# Patient Record
Sex: Female | Born: 1940 | Race: White | Hispanic: No | Marital: Married | Smoking: Never smoker
Health system: Southern US, Community
[De-identification: ages and names within clinical notes are randomized; demographics above are authoritative.]

## PROBLEM LIST (undated history)

## (undated) DIAGNOSIS — R2231 Localized swelling, mass and lump, right upper limb: Secondary | ICD-10-CM

## (undated) DIAGNOSIS — R2230 Localized swelling, mass and lump, unspecified upper limb: Secondary | ICD-10-CM

## (undated) DIAGNOSIS — H269 Unspecified cataract: Secondary | ICD-10-CM

## (undated) DIAGNOSIS — Z853 Personal history of malignant neoplasm of breast: Secondary | ICD-10-CM

## (undated) DIAGNOSIS — M1990 Unspecified osteoarthritis, unspecified site: Secondary | ICD-10-CM

## (undated) DIAGNOSIS — C801 Malignant (primary) neoplasm, unspecified: Secondary | ICD-10-CM

## (undated) DIAGNOSIS — Z974 Presence of external hearing-aid: Secondary | ICD-10-CM

## (undated) DIAGNOSIS — I10 Essential (primary) hypertension: Secondary | ICD-10-CM

## (undated) DIAGNOSIS — Z972 Presence of dental prosthetic device (complete) (partial): Secondary | ICD-10-CM

## (undated) HISTORY — PX: WISDOM TOOTH EXTRACTION: SHX21

## (undated) HISTORY — PX: BREAST LUMPECTOMY: SHX2

## (undated) HISTORY — DX: Unspecified cataract: H26.9

## (undated) HISTORY — DX: Malignant (primary) neoplasm, unspecified: C80.1

## (undated) HISTORY — PX: REMOVAL OF URINARY SLING: SHX6218

## (undated) HISTORY — PX: COLONOSCOPY: SHX174

## (undated) HISTORY — PX: CATARACT EXTRACTION W/ INTRAOCULAR LENS  IMPLANT, BILATERAL: SHX1307

## (undated) HISTORY — PX: CATARACT EXTRACTION: SUR2

## (undated) HISTORY — PX: LUMBAR FUSION: SHX111

## (undated) HISTORY — PX: ABDOMINAL HYSTERECTOMY: SHX81

---

## 1993-05-20 HISTORY — PX: ABDOMINAL HYSTERECTOMY: SHX81

## 1997-08-18 ENCOUNTER — Encounter: Admission: RE | Admit: 1997-08-18 | Discharge: 1997-11-16 | Payer: Self-pay | Admitting: Radiation Oncology

## 1998-08-21 ENCOUNTER — Other Ambulatory Visit: Admission: RE | Admit: 1998-08-21 | Discharge: 1998-08-21 | Payer: Self-pay | Admitting: General Surgery

## 1998-09-06 ENCOUNTER — Ambulatory Visit (HOSPITAL_BASED_OUTPATIENT_CLINIC_OR_DEPARTMENT_OTHER): Admission: RE | Admit: 1998-09-06 | Discharge: 1998-09-06 | Payer: Self-pay | Admitting: General Surgery

## 1998-09-06 ENCOUNTER — Encounter: Payer: Self-pay | Admitting: General Surgery

## 1998-09-22 ENCOUNTER — Ambulatory Visit (HOSPITAL_COMMUNITY): Admission: RE | Admit: 1998-09-22 | Discharge: 1998-09-22 | Payer: Self-pay | Admitting: General Surgery

## 1998-09-22 ENCOUNTER — Encounter: Payer: Self-pay | Admitting: General Surgery

## 1998-10-18 ENCOUNTER — Ambulatory Visit (HOSPITAL_COMMUNITY): Admission: RE | Admit: 1998-10-18 | Discharge: 1998-10-18 | Payer: Self-pay | Admitting: General Surgery

## 1998-10-18 ENCOUNTER — Encounter: Payer: Self-pay | Admitting: General Surgery

## 1998-11-08 ENCOUNTER — Encounter: Admission: RE | Admit: 1998-11-08 | Discharge: 1999-02-06 | Payer: Self-pay | Admitting: Radiation Oncology

## 1999-08-27 ENCOUNTER — Encounter: Payer: Self-pay | Admitting: Family Medicine

## 1999-08-27 ENCOUNTER — Encounter: Admission: RE | Admit: 1999-08-27 | Discharge: 1999-08-27 | Payer: Self-pay | Admitting: Family Medicine

## 1999-11-07 ENCOUNTER — Encounter: Payer: Self-pay | Admitting: Emergency Medicine

## 1999-11-07 ENCOUNTER — Emergency Department (HOSPITAL_COMMUNITY): Admission: EM | Admit: 1999-11-07 | Discharge: 1999-11-07 | Payer: Self-pay | Admitting: Emergency Medicine

## 2001-03-17 ENCOUNTER — Encounter: Payer: Self-pay | Admitting: Family Medicine

## 2001-03-17 ENCOUNTER — Encounter: Admission: RE | Admit: 2001-03-17 | Discharge: 2001-03-17 | Payer: Self-pay | Admitting: Family Medicine

## 2001-03-31 ENCOUNTER — Encounter: Payer: Self-pay | Admitting: Neurosurgery

## 2001-03-31 ENCOUNTER — Ambulatory Visit (HOSPITAL_COMMUNITY): Admission: RE | Admit: 2001-03-31 | Discharge: 2001-03-31 | Payer: Self-pay | Admitting: Neurosurgery

## 2001-04-22 ENCOUNTER — Encounter: Payer: Self-pay | Admitting: Neurosurgery

## 2001-04-22 ENCOUNTER — Encounter: Admission: RE | Admit: 2001-04-22 | Discharge: 2001-04-22 | Payer: Self-pay | Admitting: Neurosurgery

## 2001-05-20 HISTORY — PX: BACK SURGERY: SHX140

## 2001-06-12 ENCOUNTER — Encounter: Payer: Self-pay | Admitting: Neurosurgery

## 2001-06-16 ENCOUNTER — Encounter: Payer: Self-pay | Admitting: Neurosurgery

## 2001-06-16 ENCOUNTER — Inpatient Hospital Stay (HOSPITAL_COMMUNITY): Admission: RE | Admit: 2001-06-16 | Discharge: 2001-06-18 | Payer: Self-pay | Admitting: Neurosurgery

## 2001-07-07 ENCOUNTER — Encounter: Payer: Self-pay | Admitting: Neurosurgery

## 2001-07-07 ENCOUNTER — Inpatient Hospital Stay (HOSPITAL_COMMUNITY): Admission: RE | Admit: 2001-07-07 | Discharge: 2001-07-10 | Payer: Self-pay | Admitting: Neurosurgery

## 2001-07-30 ENCOUNTER — Emergency Department (HOSPITAL_COMMUNITY): Admission: EM | Admit: 2001-07-30 | Discharge: 2001-07-30 | Payer: Self-pay | Admitting: Emergency Medicine

## 2001-07-31 ENCOUNTER — Encounter: Payer: Self-pay | Admitting: Neurosurgery

## 2001-07-31 ENCOUNTER — Encounter: Admission: RE | Admit: 2001-07-31 | Discharge: 2001-07-31 | Payer: Self-pay | Admitting: Neurosurgery

## 2001-08-01 ENCOUNTER — Emergency Department (HOSPITAL_COMMUNITY): Admission: EM | Admit: 2001-08-01 | Discharge: 2001-08-01 | Payer: Self-pay | Admitting: Emergency Medicine

## 2001-09-15 ENCOUNTER — Ambulatory Visit (HOSPITAL_COMMUNITY): Admission: RE | Admit: 2001-09-15 | Discharge: 2001-09-15 | Payer: Self-pay | Admitting: Neurosurgery

## 2002-03-30 ENCOUNTER — Other Ambulatory Visit: Admission: RE | Admit: 2002-03-30 | Discharge: 2002-03-30 | Payer: Self-pay | Admitting: Gynecology

## 2002-05-20 HISTORY — PX: COLONOSCOPY: SHX174

## 2003-04-07 ENCOUNTER — Emergency Department (HOSPITAL_COMMUNITY): Admission: EM | Admit: 2003-04-07 | Discharge: 2003-04-07 | Payer: Self-pay | Admitting: *Deleted

## 2003-04-11 ENCOUNTER — Encounter: Admission: RE | Admit: 2003-04-11 | Discharge: 2003-04-11 | Payer: Self-pay | Admitting: Family Medicine

## 2003-05-10 ENCOUNTER — Encounter: Admission: RE | Admit: 2003-05-10 | Discharge: 2003-05-10 | Payer: Self-pay | Admitting: Family Medicine

## 2003-05-17 ENCOUNTER — Encounter: Admission: RE | Admit: 2003-05-17 | Discharge: 2003-05-17 | Payer: Self-pay | Admitting: Family Medicine

## 2003-08-19 HISTORY — PX: TRANSOBTURATOR SLING: SHX2571

## 2003-08-25 ENCOUNTER — Ambulatory Visit (HOSPITAL_BASED_OUTPATIENT_CLINIC_OR_DEPARTMENT_OTHER): Admission: RE | Admit: 2003-08-25 | Discharge: 2003-08-25 | Payer: Self-pay | Admitting: Urology

## 2003-08-25 ENCOUNTER — Ambulatory Visit (HOSPITAL_COMMUNITY): Admission: RE | Admit: 2003-08-25 | Discharge: 2003-08-25 | Payer: Self-pay | Admitting: Urology

## 2003-10-12 ENCOUNTER — Ambulatory Visit (HOSPITAL_COMMUNITY): Admission: RE | Admit: 2003-10-12 | Discharge: 2003-10-12 | Payer: Self-pay | Admitting: Specialist

## 2003-12-07 ENCOUNTER — Ambulatory Visit (HOSPITAL_COMMUNITY): Admission: RE | Admit: 2003-12-07 | Discharge: 2003-12-07 | Payer: Self-pay | Admitting: Specialist

## 2003-12-20 ENCOUNTER — Ambulatory Visit (HOSPITAL_BASED_OUTPATIENT_CLINIC_OR_DEPARTMENT_OTHER): Admission: RE | Admit: 2003-12-20 | Discharge: 2003-12-20 | Payer: Self-pay | Admitting: Urology

## 2004-09-13 ENCOUNTER — Ambulatory Visit: Payer: Self-pay | Admitting: Oncology

## 2004-12-12 ENCOUNTER — Encounter: Admission: RE | Admit: 2004-12-12 | Discharge: 2004-12-12 | Payer: Self-pay | Admitting: Family Medicine

## 2005-03-14 ENCOUNTER — Ambulatory Visit: Payer: Self-pay | Admitting: Oncology

## 2005-10-12 ENCOUNTER — Ambulatory Visit: Payer: Self-pay | Admitting: Oncology

## 2005-10-17 LAB — CBC WITH DIFFERENTIAL/PLATELET
BASO%: 0.5 % (ref 0.0–2.0)
Basophils Absolute: 0 10*3/uL (ref 0.0–0.1)
EOS%: 0.9 % (ref 0.0–7.0)
Eosinophils Absolute: 0 10*3/uL (ref 0.0–0.5)
HCT: 41.3 % (ref 34.8–46.6)
HGB: 13.8 g/dL (ref 11.6–15.9)
LYMPH%: 27.2 % (ref 14.0–48.0)
MCH: 29.8 pg (ref 26.0–34.0)
MCHC: 33.4 g/dL (ref 32.0–36.0)
MCV: 89.4 fL (ref 81.0–101.0)
MONO#: 0.3 10*3/uL (ref 0.1–0.9)
MONO%: 7 % (ref 0.0–13.0)
NEUT#: 3.1 10*3/uL (ref 1.5–6.5)
NEUT%: 64.4 % (ref 39.6–76.8)
Platelets: 223 10*3/uL (ref 145–400)
RBC: 4.62 10*6/uL (ref 3.70–5.32)
RDW: 13.5 % (ref 11.3–14.5)
WBC: 4.9 10*3/uL (ref 3.9–10.0)
lymph#: 1.3 10*3/uL (ref 0.9–3.3)

## 2005-10-17 LAB — COMPREHENSIVE METABOLIC PANEL
ALT: 13 U/L (ref 0–40)
AST: 16 U/L (ref 0–37)
Albumin: 4 g/dL (ref 3.5–5.2)
Alkaline Phosphatase: 72 U/L (ref 39–117)
BUN: 14 mg/dL (ref 6–23)
CO2: 28 mEq/L (ref 19–32)
Calcium: 9.5 mg/dL (ref 8.4–10.5)
Chloride: 103 mEq/L (ref 96–112)
Creatinine, Ser: 0.8 mg/dL (ref 0.4–1.2)
Glucose, Bld: 90 mg/dL (ref 70–99)
Potassium: 4 mEq/L (ref 3.5–5.3)
Sodium: 141 mEq/L (ref 135–145)
Total Bilirubin: 0.5 mg/dL (ref 0.3–1.2)
Total Protein: 6.8 g/dL (ref 6.0–8.3)

## 2005-10-17 LAB — CANCER ANTIGEN 27.29: CA 27.29: 22 U/mL (ref 0–39)

## 2005-10-17 LAB — LACTATE DEHYDROGENASE: LDH: 139 U/L (ref 94–250)

## 2005-12-30 ENCOUNTER — Ambulatory Visit: Payer: Self-pay | Admitting: Oncology

## 2006-08-04 ENCOUNTER — Ambulatory Visit: Payer: Self-pay | Admitting: Oncology

## 2006-10-14 ENCOUNTER — Ambulatory Visit: Payer: Self-pay | Admitting: Oncology

## 2006-10-16 LAB — COMPREHENSIVE METABOLIC PANEL
ALT: 12 U/L (ref 0–35)
AST: 16 U/L (ref 0–37)
Albumin: 4.1 g/dL (ref 3.5–5.2)
Alkaline Phosphatase: 78 U/L (ref 39–117)
BUN: 14 mg/dL (ref 6–23)
CO2: 29 mEq/L (ref 19–32)
Calcium: 9.4 mg/dL (ref 8.4–10.5)
Chloride: 102 mEq/L (ref 96–112)
Creatinine, Ser: 0.71 mg/dL (ref 0.40–1.20)
Glucose, Bld: 72 mg/dL (ref 70–99)
Potassium: 3.6 mEq/L (ref 3.5–5.3)
Sodium: 139 mEq/L (ref 135–145)
Total Bilirubin: 0.4 mg/dL (ref 0.3–1.2)
Total Protein: 7.2 g/dL (ref 6.0–8.3)

## 2006-10-16 LAB — CBC WITH DIFFERENTIAL/PLATELET
BASO%: 0.6 % (ref 0.0–2.0)
Basophils Absolute: 0 10*3/uL (ref 0.0–0.1)
EOS%: 1.4 % (ref 0.0–7.0)
Eosinophils Absolute: 0.1 10*3/uL (ref 0.0–0.5)
HCT: 40.2 % (ref 34.8–46.6)
HGB: 14 g/dL (ref 11.6–15.9)
LYMPH%: 27.4 % (ref 14.0–48.0)
MCH: 30.5 pg (ref 26.0–34.0)
MCHC: 34.8 g/dL (ref 32.0–36.0)
MCV: 87.6 fL (ref 81.0–101.0)
MONO#: 0.6 10*3/uL (ref 0.1–0.9)
MONO%: 9.7 % (ref 0.0–13.0)
NEUT#: 3.7 10*3/uL (ref 1.5–6.5)
NEUT%: 60.9 % (ref 39.6–76.8)
Platelets: 216 10*3/uL (ref 145–400)
RBC: 4.59 10*6/uL (ref 3.70–5.32)
RDW: 14.1 % (ref 11.3–14.5)
WBC: 6 10*3/uL (ref 3.9–10.0)
lymph#: 1.6 10*3/uL (ref 0.9–3.3)

## 2006-10-16 LAB — LACTATE DEHYDROGENASE: LDH: 141 U/L (ref 94–250)

## 2006-10-16 LAB — CANCER ANTIGEN 27.29: CA 27.29: 18 U/mL (ref 0–39)

## 2007-09-24 ENCOUNTER — Ambulatory Visit: Payer: Self-pay | Admitting: Oncology

## 2007-09-28 LAB — COMPREHENSIVE METABOLIC PANEL
ALT: 11 U/L (ref 0–35)
AST: 15 U/L (ref 0–37)
Albumin: 4 g/dL (ref 3.5–5.2)
Alkaline Phosphatase: 67 U/L (ref 39–117)
BUN: 14 mg/dL (ref 6–23)
CO2: 27 mEq/L (ref 19–32)
Calcium: 9.5 mg/dL (ref 8.4–10.5)
Chloride: 103 mEq/L (ref 96–112)
Creatinine, Ser: 0.96 mg/dL (ref 0.40–1.20)
Glucose, Bld: 99 mg/dL (ref 70–99)
Potassium: 4.3 mEq/L (ref 3.5–5.3)
Sodium: 139 mEq/L (ref 135–145)
Total Bilirubin: 0.2 mg/dL — ABNORMAL LOW (ref 0.3–1.2)
Total Protein: 6.8 g/dL (ref 6.0–8.3)

## 2007-09-28 LAB — CBC WITH DIFFERENTIAL/PLATELET
BASO%: 1 % (ref 0.0–2.0)
Basophils Absolute: 0.1 10*3/uL (ref 0.0–0.1)
EOS%: 1.2 % (ref 0.0–7.0)
Eosinophils Absolute: 0.1 10*3/uL (ref 0.0–0.5)
HCT: 38.2 % (ref 34.8–46.6)
HGB: 13.1 g/dL (ref 11.6–15.9)
LYMPH%: 32.5 % (ref 14.0–48.0)
MCH: 30 pg (ref 26.0–34.0)
MCHC: 34.4 g/dL (ref 32.0–36.0)
MCV: 87.4 fL (ref 81.0–101.0)
MONO#: 0.3 10*3/uL (ref 0.1–0.9)
MONO%: 5.7 % (ref 0.0–13.0)
NEUT#: 3.2 10*3/uL (ref 1.5–6.5)
NEUT%: 59.6 % (ref 39.6–76.8)
Platelets: 231 10*3/uL (ref 145–400)
RBC: 4.37 10*6/uL (ref 3.70–5.32)
RDW: 14.1 % (ref 11.3–14.5)
WBC: 5.3 10*3/uL (ref 3.9–10.0)
lymph#: 1.7 10*3/uL (ref 0.9–3.3)

## 2007-09-28 LAB — CANCER ANTIGEN 27.29: CA 27.29: 21 U/mL (ref 0–39)

## 2007-09-28 LAB — LACTATE DEHYDROGENASE: LDH: 145 U/L (ref 94–250)

## 2008-10-10 ENCOUNTER — Ambulatory Visit: Payer: Self-pay | Admitting: Oncology

## 2008-10-12 LAB — CBC WITH DIFFERENTIAL/PLATELET
BASO%: 0.4 % (ref 0.0–2.0)
Basophils Absolute: 0 10*3/uL (ref 0.0–0.1)
EOS%: 0.9 % (ref 0.0–7.0)
Eosinophils Absolute: 0.1 10*3/uL (ref 0.0–0.5)
HCT: 41.6 % (ref 34.8–46.6)
HGB: 13.8 g/dL (ref 11.6–15.9)
LYMPH%: 26.6 % (ref 14.0–49.7)
MCH: 29.8 pg (ref 25.1–34.0)
MCHC: 33.2 g/dL (ref 31.5–36.0)
MCV: 89.6 fL (ref 79.5–101.0)
MONO#: 0.5 10*3/uL (ref 0.1–0.9)
MONO%: 6.7 % (ref 0.0–14.0)
NEUT#: 4.4 10*3/uL (ref 1.5–6.5)
NEUT%: 65.4 % (ref 38.4–76.8)
Platelets: 248 10*3/uL (ref 145–400)
RBC: 4.64 10*6/uL (ref 3.70–5.45)
RDW: 14.3 % (ref 11.2–14.5)
WBC: 6.8 10*3/uL (ref 3.9–10.3)
lymph#: 1.8 10*3/uL (ref 0.9–3.3)

## 2008-10-13 LAB — COMPREHENSIVE METABOLIC PANEL
ALT: 11 U/L (ref 0–35)
AST: 14 U/L (ref 0–37)
Albumin: 3.9 g/dL (ref 3.5–5.2)
Alkaline Phosphatase: 74 U/L (ref 39–117)
BUN: 20 mg/dL (ref 6–23)
CO2: 26 mEq/L (ref 19–32)
Calcium: 8.9 mg/dL (ref 8.4–10.5)
Chloride: 105 mEq/L (ref 96–112)
Creatinine, Ser: 0.84 mg/dL (ref 0.40–1.20)
Glucose, Bld: 95 mg/dL (ref 70–99)
Potassium: 4.3 mEq/L (ref 3.5–5.3)
Sodium: 140 mEq/L (ref 135–145)
Total Bilirubin: 0.3 mg/dL (ref 0.3–1.2)
Total Protein: 7 g/dL (ref 6.0–8.3)

## 2008-10-13 LAB — LACTATE DEHYDROGENASE: LDH: 129 U/L (ref 94–250)

## 2008-10-13 LAB — CANCER ANTIGEN 27.29: CA 27.29: 19 U/mL (ref 0–39)

## 2008-10-13 LAB — VITAMIN D 25 HYDROXY (VIT D DEFICIENCY, FRACTURES): Vit D, 25-Hydroxy: 28 ng/mL — ABNORMAL LOW (ref 30–89)

## 2010-10-05 NOTE — H&P (Signed)
[place]  Patient:    Sarah Barrett, Sarah Barrett Visit Number: [id number] MRN: [id number]          Service Type: SUR Location: 3000 [id number] Attending Physician:  Emeterio Reeve Dictated by:   Payton Doughty, M.D. Admit Date:  07/07/2001                           History and Physical  ADMITTING DIAGNOSIS:  Spondylosis L5-S1.  SERVICE:  Neurosurgery.  HISTORY OF PRESENT ILLNESS:  A very nice 70 year old right-handed white lady, two weeks ago started doing a lumbar fusion at 5-1 and she developed pulmonary desaturation.  Obtained a CT scan which ruled out a pulmonary embolus but did show a significant amount of bronchial difficulties and therefore she was kept in the hospital for a couple of days.  Congestion cleared, waited a couple of weeks.  Chest x-rays now clear and she is admitted for L5-S1 fusion.  MEDICAL HISTORY:  Remarkable for breast cancer for which she has had a biopsy in 1995 and lumpectomy and lymph node dissection in 1999, and a lumpectomy on the left side in 2000.  She has had two C sections, a hysterectomy in 1995. She has had an appendectomy and ovarian cyst in the 1960s.  ALLERGIES:  None.  MEDICATIONS:  She is on tamoxifen and her doctor is Dr. Lyndal Pulley.  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  FAMILY HISTORY:  Her mom died in her 2s of cancer and has extensive female cancers in her family.  Daddy died in his mid 45s of unknown causes.  REVIEW OF SYSTEMS:  Unremarkable.  PHYSICAL EXAMINATION:  HEENT:  Within normal limits.  NECK:  She has not had any point tenderness in her neck.  CARDIAC:  Regular rate and rhythm.  BACK:  When she bends forward, it causes her low back pain.  Percussion does not reproduce her pain.  CHEST:  Clear at this time.  ABDOMEN:  Nontender, no hepatosplenomegaly.  EXTREMITIES:  Without clubbing or cyanosis.  GENITOURINARY:  Deferred.  PERIPHERAL PULSES:   Good.  NEUROLOGIC:  She is awake, alert, and oriented.  Her cranial nerves are intact.  Motor exam shows 5/5 strength throughout the upper and lower extremities.  There is current sensory deficit.  Reflexes are 1 at the knees, 1 at the ankles.  Toes are downgoing bilateral and straight leg raise is negative.  There is no clonus; Hoffmanns is negative.  LABORATORY DATA:  MRI demonstrates severe spondylosis at 5-1 and she had an effusion of the right 5-1 facet joint.  There is also a right-sided 5-1 foraminal disk.  PLAN:  The plan once again is for a 5-1 laminectomy, diskectomy, posterior lumbar interbody fusion.  The risks and benefits of this approach have been discussed with her and she wished to proceed. Dictated by:   Payton Doughty, M.D. Attending Physician:  Emeterio Reeve DD:  07/07/01 TD:  07/07/01 Job: [id number]

## 2010-10-05 NOTE — Discharge Summary (Signed)
[place]. [facility]  Patient:    Sarah Barrett, Sarah Barrett Visit Number: [id number] MRN: [id number]          Service Type: SUR Location: 3000 3019 01 Attending Physician:  Emeterio Reeve Dictated by:   Payton Doughty, M.D. Admit Date:  07/07/2001 Discharge Date: 07/10/2001                             Discharge Summary  ADMISSION DIAGNOSIS:  Spondylosis at L5-S1.  DISCHARGE DIAGNOSIS:  Spondylosis at L5-S1.  OPERATION:  L5-S1 laminectomy, diskectomy, posterior lumbar interbody fusion.  SURGEON:  Payton Doughty, M.D.  COMPLICATIONS:  None.  DISCHARGE STATUS:  Alive and well.  HISTORY OF PRESENT ILLNESS:  This 70 year old right-handed white female whose history and physical is recounted in the chart, has had spondylosis for a long time.  She had positive discography and failed epidural steroids.  She tried to have an operation about two weeks ago, and had some pulmonary complications which have now resolved.  PAST MEDICAL HISTORY:  Breast cancer.  CURRENT MEDICATIONS:  Tamoxifen.  PHYSICAL EXAMINATION:  General examination is unremarkable.  NEUROLOGICAL EXAMINATION:  Intact with intractable back pain to straight leg raise.  HOSPITAL COURSE:  She was admitted after ascertaining normal laboratory values, and underwent a L5-S1 laminectomy, diskectomy, and posterior lumbar interbody fusion.  Postoperatively, she did extremely well.  Her strength is full.  Her incisions are dry and well healing.  She is eating and voiding normally.  She is being discharged home on Darvocet for pain.  FOLLOWUP:  [facility] in one week for sutures. Dictated by:   Payton Doughty, M.D. Attending Physician:  Emeterio Reeve DD:  07/10/01 TD:  07/10/01 Job: [id number]

## 2010-10-05 NOTE — Op Note (Signed)
NAME:  Sarah Barrett, Sarah Barrett                               ACCOUNT NO.:  [id number]   MEDICAL RECORD NO.:  [id number]                   PATIENT TYPE:  AMB   LOCATION:  NESC                                 FACILITY:  [facility]   PHYSICIAN:  Excell Seltzer. Annabell Howells, M.D.                 DATE OF BIRTH:  Dec 27, 1940   DATE OF PROCEDURE:  12/20/2003  DATE OF DISCHARGE:                                 OPERATIVE REPORT   PROCEDURE:  Removal of OB Tape sling.   PREOPERATIVE DIAGNOSIS:  Vaginal extrusion of sling.   POSTOPERATIVE DIAGNOSIS:  Vaginal extrusion of infected sling.   SURGEON:  Excell Seltzer. Annabell Howells, M.D.   ANESTHESIA:  General.   COMPLICATIONS:  None.   SPECIMENS:  OB Tape.   COMPLICATIONS:  None.   INDICATION:  Sarah Barrett is a 70 year old white female who had an OB Tape  transobturator sling placed in April.  She had done well until about a month  ago when she began to have some vaginal discharge and bleeding.  She  initially was treated with Flagyl for a presumed bacterial vaginosis.  This  did not resolve the problem.  I reexamined her as did not find evidence of  extrusion on my initial exam.  On the follow-up exam, she was found to have  some granulation tissue over the mid urethra and, with further palpation,  the sling could be felt within this granulation.  It was felt that she  needed to have revision of the sling with probable excision of exposed mesh.   FINDINGS OF PROCEDURE:  The patient was taken to the operating room after  receiving p.o. Cipro.  She was placed in the lithotomy position while awake  to avoid any positioning problems as she had had some pain her left leg  following her last procedure.  She then was given IV sedation.  Her  genitalia was prepped with Betadine solution, and she was draped in the  usual sterile fashion.  The anterior vaginal wall was infiltrated with about  5 cubic centimeters of 1% lidocaine with epinephrine using a 22 gauge  needle.  The edges of the incision  were then grasped with Allis clamps, and  the incision was extended for a total length of approximately 2 cm.  Inspection revealed exposed mesh in the midline.  Palpation along the  lateral limbs of the mesh revealed a cavity that extended several  centimeters laterally on each side suggesting a more complete infection of  the mesh with failure of the mesh to be incorporated by the body's tissues.  A right angle clamp was placed behind the mesh.  It was then removed with  very little difficulty from each side.  There was no evidence of tissue  ingrowth along any portion of the mesh.  At this point, the wound was  cleaned and a 1/2 inch iodoform gauze  was packed into each side of the  vaginal incision, the remainder into the vaginal vault.  Cystoscopy was then  performed to insure no evidence of urethral injury.  This was  done with a 22 [place] scope and a 70 degree lens.  The bladder and urethra  were unremarkable.  At this point, the patient was taken down from the  lithotomy position.  Her anesthetic was reversed.  She was moved to the  recovery room in stable condition, and there were no complications.                                               Excell Seltzer. Annabell Howells, M.D.    JJW/MEDQ  D:  12/20/2003  T:  12/20/2003  Job:  [id number]   cc:   Gretta Arab. Valentina Lucks, M.D.  [address] [place]  [place] [address]  Fax: [phone]

## 2012-06-17 ENCOUNTER — Encounter: Payer: Self-pay | Admitting: Internal Medicine

## 2012-06-22 ENCOUNTER — Encounter: Payer: Self-pay | Admitting: Internal Medicine

## 2012-07-15 ENCOUNTER — Ambulatory Visit (AMBULATORY_SURGERY_CENTER): Payer: Medicare PPO | Admitting: *Deleted

## 2012-07-15 ENCOUNTER — Encounter: Payer: Self-pay | Admitting: Internal Medicine

## 2012-07-15 VITALS — Ht 59.5 in | Wt 136.2 lb

## 2012-07-15 DIAGNOSIS — Z1211 Encounter for screening for malignant neoplasm of colon: Secondary | ICD-10-CM

## 2012-07-15 MED ORDER — MOVIPREP 100 G PO SOLR: 1.0000 | Freq: Once | ORAL | Status: DC

## 2012-07-15 NOTE — Progress Notes (Signed)
No egg or soy allergy. ewm No problems with sedation in the past. Morphine made pt hallucinate and she doesn't want morphine . ewm

## 2012-07-29 ENCOUNTER — Ambulatory Visit (AMBULATORY_SURGERY_CENTER): Payer: Medicare PPO | Admitting: Internal Medicine

## 2012-07-29 ENCOUNTER — Encounter: Payer: Self-pay | Admitting: Internal Medicine

## 2012-07-29 VITALS — BP 116/79 | HR 69 | Temp 97.1°F | Resp 19 | Ht 59.0 in | Wt 136.0 lb

## 2012-07-29 DIAGNOSIS — Z1211 Encounter for screening for malignant neoplasm of colon: Secondary | ICD-10-CM

## 2012-07-29 MED ORDER — SODIUM CHLORIDE 0.9 % IV SOLN: 500.0000 mL | INTRAVENOUS | Status: DC

## 2012-07-29 NOTE — Progress Notes (Signed)
Patient did not experience any of the following events: a burn prior to discharge; a fall within the facility; wrong site/side/patient/procedure/implant event; or a hospital transfer or hospital admission upon discharge from the facility. (G8907) Patient did not have preoperative order for IV antibiotic SSI prophylaxis. (G8918)  

## 2012-07-29 NOTE — Op Note (Addendum)
[facility] [address] [place], [address]   COLONOSCOPY PROCEDURE REPORT  PATIENT: Sarah Barrett, Sarah Barrett  MR#: [id number] BIRTHDATE: 02-24-1941 , 71  yrs. old GENDER: Female ENDOSCOPIST: Hart Carwin, MD REFERRED BY:    Dr [address] PROCEDURE DATE:  07/29/2012 PROCEDURE:   Colonoscopy, screening ASA CLASS:   Class II INDICATIONS:Average risk patient for colon cancer and last colon 2004 - hems. MEDICATIONS: MAC sedation, administered by CRNA and propofol (Diprivan) 200mg  IV  DESCRIPTION OF PROCEDURE:   After the risks and benefits and of the procedure were explained, informed consent was obtained.  A digital rectal exam revealed no abnormalities of the rectum.    The LB CF-[id number]  endoscope was introduced through the anus and advanced to the cecum, which was identified by both the appendix and ileocecal valve .  The quality of the prep was excellent, using MoviPrep .  The instrument was then slowly withdrawn as the colon was fully examined.     COLON FINDINGS: Moderate sized external hemorrhoids were found. Retroflexed views revealed no abnormalities.     The scope was then withdrawn from the patient and the procedure completed.  COMPLICATIONS: There were no complications. ENDOSCOPIC IMPRESSION: Moderate sized external hemorrhoids  RECOMMENDATIONS: high fiber diet  REPEAT EXAM: for No recall due to age..  cc:  _______________________________ eSignedHart Carwin, MD 07/29/2012 11:05 AM Revised: 07/29/2012 11:05 AM

## 2012-07-29 NOTE — Patient Instructions (Addendum)
YOU HAD AN ENDOSCOPIC PROCEDURE TODAY AT [facility]: Refer to the procedure report that was given to you for any specific questions about what was found during the examination.  If the procedure report does not answer your questions, please call your gastroenterologist to clarify.  If you requested that your care partner not be given the details of your procedure findings, then the procedure report has been included in a sealed envelope for you to review at your convenience later.  YOU SHOULD EXPECT: Some feelings of bloating in the abdomen. Passage of more gas than usual.  Walking can help get rid of the air that was put into your GI tract during the procedure and reduce the bloating. If you had a lower endoscopy (such as a colonoscopy or flexible sigmoidoscopy) you may notice spotting of blood in your stool or on the toilet paper. If you underwent a bowel prep for your procedure, then you may not have a normal bowel movement for a few days.  DIET: Your first meal following the procedure should be a light meal and then it is ok to progress to your normal diet.  A half-sandwich or bowl of soup is an example of a good first meal.  Heavy or fried foods are harder to digest and may make you feel nauseous or bloated.  Likewise meals heavy in dairy and vegetables can cause extra gas to form and this can also increase the bloating.  Drink plenty of fluids but you should avoid alcoholic beverages for 24 hours.  ACTIVITY: Your care partner should take you home directly after the procedure.  You should plan to take it easy, moving slowly for the rest of the day.  You can resume normal activity the day after the procedure however you should NOT DRIVE or use heavy machinery for 24 hours (because of the sedation medicines used during the test).    SYMPTOMS TO REPORT IMMEDIATELY: A gastroenterologist can be reached at any hour.  During normal business hours, 8:30 AM to 5:00 PM Monday through Friday,  call [phone].  After hours and on weekends, please call the GI answering service at [phone] who will take a message and have the physician on call contact you.   Following lower endoscopy (colonoscopy or flexible sigmoidoscopy):  Excessive amounts of blood in the stool  Significant tenderness or worsening of abdominal pains  Swelling of the abdomen that is new, acute  Fever of 100F or higher F FOLLOW UP: If any biopsies were taken you will be contacted by phone or by letter within the next 1-3 weeks.  Call your gastroenterologist if you have not heard about the biopsies in 3 weeks.  Our staff will call the home number listed on your records the next business day following your procedure to check on you and address any questions or concerns that you may have at that time regarding the information given to you following your procedure. This is a courtesy call and so if there is no answer at the home number and we have not heard from you through the emergency physician on call, we will assume that you have returned to your regular daily activities without incident.  SIGNATURES/CONFIDENTIALITY: You and/or your care partner have signed paperwork which will be entered into your electronic medical record.  These signatures attest to the fact that that the information above on your After Visit Summary has been reviewed and is understood.  Full responsibility of the confidentiality of this  discharge information lies with you and/or your care-partner.  Hemorrhoid information and high fiber diet information given.

## 2012-07-30 ENCOUNTER — Telehealth: Payer: Self-pay | Admitting: *Deleted

## 2012-07-30 NOTE — Telephone Encounter (Signed)
  Follow up Call-  Call back number 07/29/2012  Post procedure Call Back phone  # [phone]  Permission to leave phone message Yes     Patient questions:  Do you have a fever, pain , or abdominal swelling? no Pain Score  0 *  Have you tolerated food without any problems? yes  Have you been able to return to your normal activities? yes  Do you have any questions about your discharge instructions: Diet   no Medications  no Follow up visit  no  Do you have questions or concerns about your Care? no  Actions: * If pain score is 4 or above: No action needed, pain <4.

## 2013-11-04 ENCOUNTER — Ambulatory Visit
Admission: RE | Admit: 2013-11-04 | Discharge: 2013-11-04 | Disposition: A | Discharge status: Home / Self Care | Payer: Commercial Managed Care - HMO | Source: Ambulatory Visit | Attending: Family Medicine | Admitting: Family Medicine

## 2013-11-04 ENCOUNTER — Other Ambulatory Visit: Payer: Self-pay | Admitting: Family Medicine

## 2013-11-04 DIAGNOSIS — R52 Pain, unspecified: Secondary | ICD-10-CM

## 2013-11-22 ENCOUNTER — Ambulatory Visit (INDEPENDENT_AMBULATORY_CARE_PROVIDER_SITE_OTHER): Payer: Commercial Managed Care - HMO | Admitting: Radiology

## 2013-11-22 ENCOUNTER — Encounter (INDEPENDENT_AMBULATORY_CARE_PROVIDER_SITE_OTHER): Payer: Self-pay

## 2013-11-22 DIAGNOSIS — G5600 Carpal tunnel syndrome, unspecified upper limb: Secondary | ICD-10-CM

## 2013-11-22 DIAGNOSIS — G5603 Carpal tunnel syndrome, bilateral upper limbs: Secondary | ICD-10-CM

## 2013-11-22 NOTE — Procedures (Signed)
   NCS (NERVE CONDUCTION STUDY) WITH EMG (ELECTROMYOGRAPHY) REPORT   STUDY DATE: July 6th 2015 PATIENT NAME: Sarah Barrett DOB: 1940-10-08 MRN: [id number]    TECHNOLOGIST: Towana Badger ELECTROMYOGRAPHER: Marcial Pacas M.D.  CLINICAL INFORMATION:   73 years old female, complains of bilateral hand pain, rule out carpal tunnel syndromes,  FINDINGS: NERVE CONDUCTION STUDY: Bilateral ulnar mixed, sensory, and motor responses were normal.  Left median mixed response is 0.4 ms prolonged compared to ipsilateral ulnar mixed response, right median mixed response is 0.3 ms prolonged compared to ipsilateral ulnar mixed response. Bilateral median sensory and motor responses were within normal limits. Bilateral radial sensory responses were normal.  NEEDLE ELECTROMYOGRAPHY: Not performed  IMPRESSION:   This is a slight abnormal study. There is electrodiagnostic evidence of relative slowing of bilateral median nerve across the carpal tunnel, left side is 0.4 mse prolonged, right side is 0.3 msec prolonged, above findings could indicating a slight bilateral carpal tunnel syndromes.    INTERPRETING PHYSICIAN:    Marcial Pacas M.D. Ph.D. [facility] [address] [place], [place] [address] [phone]

## 2014-06-28 DIAGNOSIS — N6002 Solitary cyst of left breast: Secondary | ICD-10-CM | POA: Diagnosis not present

## 2014-06-28 DIAGNOSIS — Z853 Personal history of malignant neoplasm of breast: Secondary | ICD-10-CM | POA: Diagnosis not present

## 2014-06-28 DIAGNOSIS — E785 Hyperlipidemia, unspecified: Secondary | ICD-10-CM | POA: Diagnosis not present

## 2014-06-30 DIAGNOSIS — N6002 Solitary cyst of left breast: Secondary | ICD-10-CM | POA: Diagnosis not present

## 2014-06-30 DIAGNOSIS — N63 Unspecified lump in breast: Secondary | ICD-10-CM | POA: Diagnosis not present

## 2014-06-30 DIAGNOSIS — Z853 Personal history of malignant neoplasm of breast: Secondary | ICD-10-CM | POA: Diagnosis not present

## 2014-07-03 DIAGNOSIS — J069 Acute upper respiratory infection, unspecified: Secondary | ICD-10-CM | POA: Diagnosis not present

## 2014-07-03 DIAGNOSIS — J029 Acute pharyngitis, unspecified: Secondary | ICD-10-CM | POA: Diagnosis not present

## 2014-08-23 DIAGNOSIS — H524 Presbyopia: Secondary | ICD-10-CM | POA: Diagnosis not present

## 2014-08-23 DIAGNOSIS — H26493 Other secondary cataract, bilateral: Secondary | ICD-10-CM | POA: Diagnosis not present

## 2014-08-23 DIAGNOSIS — H04123 Dry eye syndrome of bilateral lacrimal glands: Secondary | ICD-10-CM | POA: Diagnosis not present

## 2014-11-02 DIAGNOSIS — Z1231 Encounter for screening mammogram for malignant neoplasm of breast: Secondary | ICD-10-CM | POA: Diagnosis not present

## 2014-11-24 IMAGING — CR DG HAND 2V*R*
2 series · 2 of 2 positions shown · non-contrast
Comparison: Left hand series from the same day reported separately.

CLINICAL DATA: 72-year-old female with intermittent pain in both
hands. Initial encounter. Query arthritis.

EXAM:
RIGHT HAND - 2 VIEW

[x hand pa right]
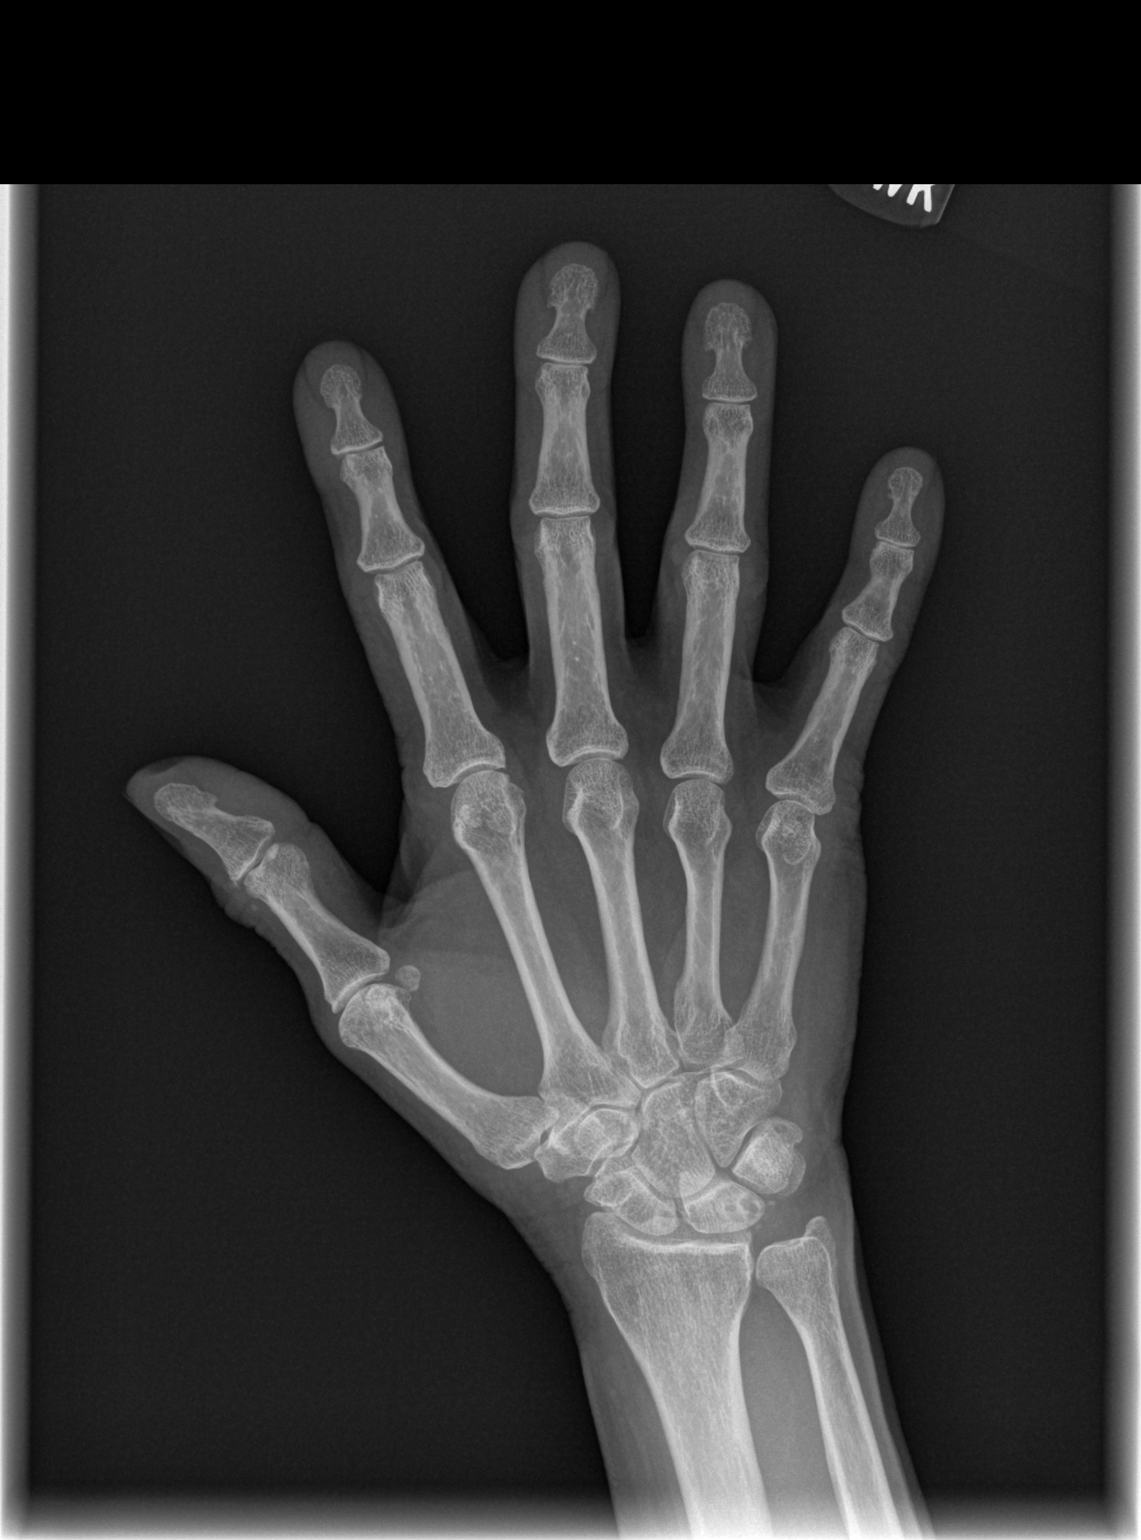

[x hand lat right]
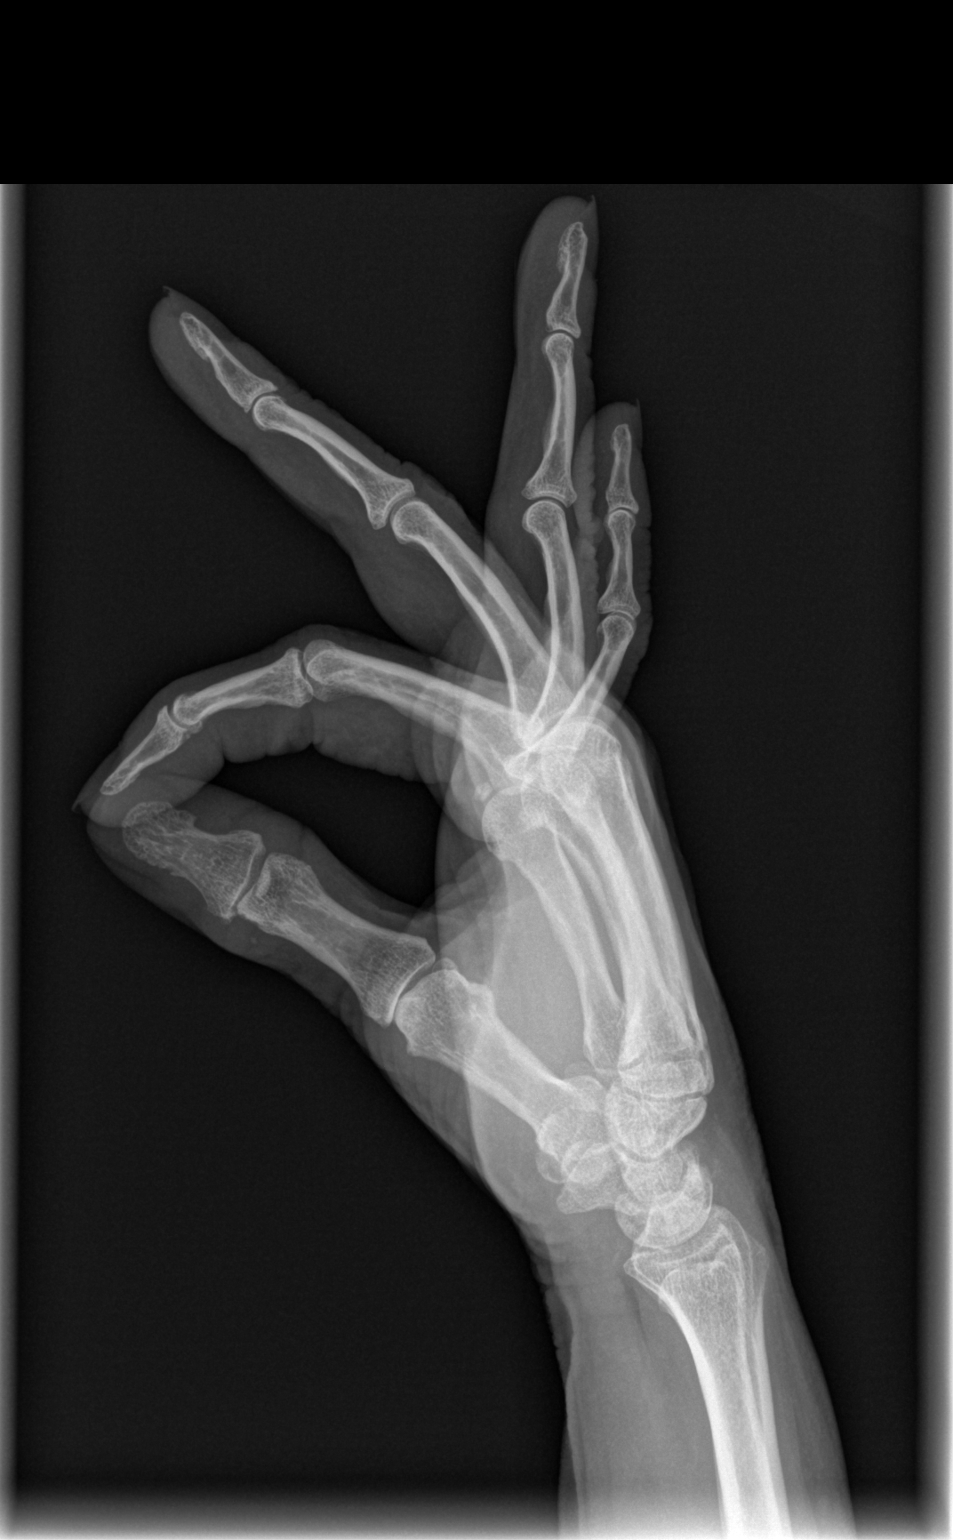

[2 of 2 positions shown; findings below may reference images not displayed]

FINDINGS: Bone mineralization is within normal limits for age. Distal radius
and ulna intact. Carpal bone alignment within normal limits. Carpal
joint spaces preserved. Several small calcific foci along the ulnar
aspect of the proximal carpal row may indicate mild carpal
chondrocalcinosis.

Mild right thumb MCP joint space loss with subchondral sclerosis and
osteophytosis. Other MCP joints are preserved. Very mild distal
joint space loss in the right hand. Mild osteophytosis including at
the thumb IP joint. No acute fracture or dislocation.
IMPRESSION: No acute osseous abnormality identified.

There are mild to moderate for age degenerative changes:

- osteoarthritis involving the right thumb, and

- possible chondrocalcinosis at the wrist, which can be seen in the
setting of calcium pyrophosphate deposition disease.

## 2014-11-29 ENCOUNTER — Encounter: Payer: Self-pay | Admitting: Genetic Counselor

## 2014-12-29 DIAGNOSIS — M25512 Pain in left shoulder: Secondary | ICD-10-CM | POA: Diagnosis not present

## 2014-12-29 DIAGNOSIS — E785 Hyperlipidemia, unspecified: Secondary | ICD-10-CM | POA: Diagnosis not present

## 2014-12-29 DIAGNOSIS — Z853 Personal history of malignant neoplasm of breast: Secondary | ICD-10-CM | POA: Diagnosis not present

## 2014-12-29 DIAGNOSIS — Z0000 Encounter for general adult medical examination without abnormal findings: Secondary | ICD-10-CM | POA: Diagnosis not present

## 2014-12-30 DIAGNOSIS — M67912 Unspecified disorder of synovium and tendon, left shoulder: Secondary | ICD-10-CM | POA: Diagnosis not present

## 2015-01-20 DIAGNOSIS — M67912 Unspecified disorder of synovium and tendon, left shoulder: Secondary | ICD-10-CM | POA: Diagnosis not present

## 2015-01-31 DIAGNOSIS — M25512 Pain in left shoulder: Secondary | ICD-10-CM | POA: Diagnosis not present

## 2015-02-08 DIAGNOSIS — M19012 Primary osteoarthritis, left shoulder: Secondary | ICD-10-CM | POA: Diagnosis not present

## 2015-03-08 DIAGNOSIS — M19012 Primary osteoarthritis, left shoulder: Secondary | ICD-10-CM | POA: Diagnosis not present

## 2015-08-23 DIAGNOSIS — H04123 Dry eye syndrome of bilateral lacrimal glands: Secondary | ICD-10-CM | POA: Diagnosis not present

## 2015-08-23 DIAGNOSIS — H5213 Myopia, bilateral: Secondary | ICD-10-CM | POA: Diagnosis not present

## 2015-08-23 DIAGNOSIS — H26493 Other secondary cataract, bilateral: Secondary | ICD-10-CM | POA: Diagnosis not present

## 2015-08-23 DIAGNOSIS — H524 Presbyopia: Secondary | ICD-10-CM | POA: Diagnosis not present

## 2015-10-31 DIAGNOSIS — R300 Dysuria: Secondary | ICD-10-CM | POA: Diagnosis not present

## 2015-10-31 DIAGNOSIS — N3090 Cystitis, unspecified without hematuria: Secondary | ICD-10-CM | POA: Diagnosis not present

## 2015-11-06 DIAGNOSIS — M8588 Other specified disorders of bone density and structure, other site: Secondary | ICD-10-CM | POA: Diagnosis not present

## 2015-11-06 DIAGNOSIS — Z853 Personal history of malignant neoplasm of breast: Secondary | ICD-10-CM | POA: Diagnosis not present

## 2015-11-06 DIAGNOSIS — Z1231 Encounter for screening mammogram for malignant neoplasm of breast: Secondary | ICD-10-CM | POA: Diagnosis not present

## 2015-12-06 DIAGNOSIS — M25539 Pain in unspecified wrist: Secondary | ICD-10-CM | POA: Diagnosis not present

## 2016-01-01 DIAGNOSIS — M79641 Pain in right hand: Secondary | ICD-10-CM | POA: Diagnosis not present

## 2016-01-01 DIAGNOSIS — M79642 Pain in left hand: Secondary | ICD-10-CM | POA: Diagnosis not present

## 2016-01-03 DIAGNOSIS — Z853 Personal history of malignant neoplasm of breast: Secondary | ICD-10-CM | POA: Diagnosis not present

## 2016-01-03 DIAGNOSIS — Z131 Encounter for screening for diabetes mellitus: Secondary | ICD-10-CM | POA: Diagnosis not present

## 2016-01-03 DIAGNOSIS — Z0000 Encounter for general adult medical examination without abnormal findings: Secondary | ICD-10-CM | POA: Diagnosis not present

## 2016-01-03 DIAGNOSIS — E785 Hyperlipidemia, unspecified: Secondary | ICD-10-CM | POA: Diagnosis not present

## 2016-01-03 DIAGNOSIS — H6120 Impacted cerumen, unspecified ear: Secondary | ICD-10-CM | POA: Diagnosis not present

## 2016-02-09 DIAGNOSIS — Z23 Encounter for immunization: Secondary | ICD-10-CM | POA: Diagnosis not present

## 2016-03-07 DIAGNOSIS — M549 Dorsalgia, unspecified: Secondary | ICD-10-CM | POA: Diagnosis not present

## 2016-03-18 DIAGNOSIS — R350 Frequency of micturition: Secondary | ICD-10-CM | POA: Diagnosis not present

## 2016-03-18 DIAGNOSIS — N3090 Cystitis, unspecified without hematuria: Secondary | ICD-10-CM | POA: Diagnosis not present

## 2016-05-20 HISTORY — PX: KNEE ARTHROSCOPY W/ MENISCAL REPAIR: SHX1877

## 2016-07-23 DIAGNOSIS — N6320 Unspecified lump in the left breast, unspecified quadrant: Secondary | ICD-10-CM | POA: Diagnosis not present

## 2016-07-25 DIAGNOSIS — Z853 Personal history of malignant neoplasm of breast: Secondary | ICD-10-CM | POA: Diagnosis not present

## 2016-07-25 DIAGNOSIS — N6012 Diffuse cystic mastopathy of left breast: Secondary | ICD-10-CM | POA: Diagnosis not present

## 2016-07-25 DIAGNOSIS — N6321 Unspecified lump in the left breast, upper outer quadrant: Secondary | ICD-10-CM | POA: Diagnosis not present

## 2016-08-28 DIAGNOSIS — H52222 Regular astigmatism, left eye: Secondary | ICD-10-CM | POA: Diagnosis not present

## 2016-08-28 DIAGNOSIS — H04123 Dry eye syndrome of bilateral lacrimal glands: Secondary | ICD-10-CM | POA: Diagnosis not present

## 2016-08-28 DIAGNOSIS — H52221 Regular astigmatism, right eye: Secondary | ICD-10-CM | POA: Diagnosis not present

## 2016-08-28 DIAGNOSIS — H524 Presbyopia: Secondary | ICD-10-CM | POA: Diagnosis not present

## 2016-08-28 DIAGNOSIS — H5212 Myopia, left eye: Secondary | ICD-10-CM | POA: Diagnosis not present

## 2016-08-28 DIAGNOSIS — H26493 Other secondary cataract, bilateral: Secondary | ICD-10-CM | POA: Diagnosis not present

## 2016-09-09 DIAGNOSIS — M25469 Effusion, unspecified knee: Secondary | ICD-10-CM | POA: Diagnosis not present

## 2016-10-01 DIAGNOSIS — M25561 Pain in right knee: Secondary | ICD-10-CM | POA: Diagnosis not present

## 2016-10-02 DIAGNOSIS — M25561 Pain in right knee: Secondary | ICD-10-CM | POA: Diagnosis not present

## 2016-10-02 DIAGNOSIS — M25461 Effusion, right knee: Secondary | ICD-10-CM | POA: Diagnosis not present

## 2016-10-21 DIAGNOSIS — M25561 Pain in right knee: Secondary | ICD-10-CM | POA: Diagnosis not present

## 2016-10-21 DIAGNOSIS — M545 Low back pain: Secondary | ICD-10-CM | POA: Diagnosis not present

## 2016-10-21 DIAGNOSIS — M1711 Unilateral primary osteoarthritis, right knee: Secondary | ICD-10-CM | POA: Diagnosis not present

## 2016-10-30 DIAGNOSIS — M25561 Pain in right knee: Secondary | ICD-10-CM | POA: Diagnosis not present

## 2016-11-06 DIAGNOSIS — S83241A Other tear of medial meniscus, current injury, right knee, initial encounter: Secondary | ICD-10-CM | POA: Diagnosis not present

## 2016-11-15 DIAGNOSIS — S83281A Other tear of lateral meniscus, current injury, right knee, initial encounter: Secondary | ICD-10-CM | POA: Diagnosis not present

## 2016-11-15 DIAGNOSIS — G8918 Other acute postprocedural pain: Secondary | ICD-10-CM | POA: Diagnosis not present

## 2016-11-15 DIAGNOSIS — M94261 Chondromalacia, right knee: Secondary | ICD-10-CM | POA: Diagnosis not present

## 2016-11-15 DIAGNOSIS — S83241A Other tear of medial meniscus, current injury, right knee, initial encounter: Secondary | ICD-10-CM | POA: Diagnosis not present

## 2016-11-15 DIAGNOSIS — M23361 Other meniscus derangements, other lateral meniscus, right knee: Secondary | ICD-10-CM | POA: Diagnosis not present

## 2016-11-15 DIAGNOSIS — M11261 Other chondrocalcinosis, right knee: Secondary | ICD-10-CM | POA: Diagnosis not present

## 2016-11-15 DIAGNOSIS — M23321 Other meniscus derangements, posterior horn of medial meniscus, right knee: Secondary | ICD-10-CM | POA: Diagnosis not present

## 2016-11-21 DIAGNOSIS — M25661 Stiffness of right knee, not elsewhere classified: Secondary | ICD-10-CM | POA: Diagnosis not present

## 2016-11-21 DIAGNOSIS — M25561 Pain in right knee: Secondary | ICD-10-CM | POA: Diagnosis not present

## 2016-11-21 DIAGNOSIS — Z9889 Other specified postprocedural states: Secondary | ICD-10-CM | POA: Diagnosis not present

## 2016-11-26 DIAGNOSIS — M25661 Stiffness of right knee, not elsewhere classified: Secondary | ICD-10-CM | POA: Diagnosis not present

## 2016-11-26 DIAGNOSIS — M25561 Pain in right knee: Secondary | ICD-10-CM | POA: Diagnosis not present

## 2016-11-28 DIAGNOSIS — M25661 Stiffness of right knee, not elsewhere classified: Secondary | ICD-10-CM | POA: Diagnosis not present

## 2016-11-28 DIAGNOSIS — M25561 Pain in right knee: Secondary | ICD-10-CM | POA: Diagnosis not present

## 2016-12-03 DIAGNOSIS — M25561 Pain in right knee: Secondary | ICD-10-CM | POA: Diagnosis not present

## 2016-12-03 DIAGNOSIS — M25661 Stiffness of right knee, not elsewhere classified: Secondary | ICD-10-CM | POA: Diagnosis not present

## 2016-12-05 DIAGNOSIS — M25561 Pain in right knee: Secondary | ICD-10-CM | POA: Diagnosis not present

## 2016-12-05 DIAGNOSIS — M25661 Stiffness of right knee, not elsewhere classified: Secondary | ICD-10-CM | POA: Diagnosis not present

## 2016-12-10 DIAGNOSIS — M25561 Pain in right knee: Secondary | ICD-10-CM | POA: Diagnosis not present

## 2016-12-10 DIAGNOSIS — M25661 Stiffness of right knee, not elsewhere classified: Secondary | ICD-10-CM | POA: Diagnosis not present

## 2016-12-12 DIAGNOSIS — M25561 Pain in right knee: Secondary | ICD-10-CM | POA: Diagnosis not present

## 2016-12-17 DIAGNOSIS — M25561 Pain in right knee: Secondary | ICD-10-CM | POA: Diagnosis not present

## 2016-12-17 DIAGNOSIS — M25661 Stiffness of right knee, not elsewhere classified: Secondary | ICD-10-CM | POA: Diagnosis not present

## 2016-12-26 DIAGNOSIS — M1711 Unilateral primary osteoarthritis, right knee: Secondary | ICD-10-CM | POA: Diagnosis not present

## 2017-01-30 DIAGNOSIS — Z9889 Other specified postprocedural states: Secondary | ICD-10-CM | POA: Diagnosis not present

## 2017-01-30 DIAGNOSIS — N6012 Diffuse cystic mastopathy of left breast: Secondary | ICD-10-CM | POA: Diagnosis not present

## 2017-01-30 DIAGNOSIS — M1711 Unilateral primary osteoarthritis, right knee: Secondary | ICD-10-CM | POA: Diagnosis not present

## 2017-02-21 DIAGNOSIS — Z23 Encounter for immunization: Secondary | ICD-10-CM | POA: Diagnosis not present

## 2017-02-21 DIAGNOSIS — Z0000 Encounter for general adult medical examination without abnormal findings: Secondary | ICD-10-CM | POA: Diagnosis not present

## 2017-02-21 DIAGNOSIS — Z131 Encounter for screening for diabetes mellitus: Secondary | ICD-10-CM | POA: Diagnosis not present

## 2017-02-21 DIAGNOSIS — E785 Hyperlipidemia, unspecified: Secondary | ICD-10-CM | POA: Diagnosis not present

## 2017-02-21 DIAGNOSIS — Z853 Personal history of malignant neoplasm of breast: Secondary | ICD-10-CM | POA: Diagnosis not present

## 2017-02-21 DIAGNOSIS — Z1389 Encounter for screening for other disorder: Secondary | ICD-10-CM | POA: Diagnosis not present

## 2017-02-21 DIAGNOSIS — Z124 Encounter for screening for malignant neoplasm of cervix: Secondary | ICD-10-CM | POA: Diagnosis not present

## 2017-04-24 DIAGNOSIS — M1711 Unilateral primary osteoarthritis, right knee: Secondary | ICD-10-CM | POA: Diagnosis not present

## 2017-05-05 DIAGNOSIS — S0340XA Sprain of jaw, unspecified side, initial encounter: Secondary | ICD-10-CM | POA: Diagnosis not present

## 2017-06-30 DIAGNOSIS — H6061 Unspecified chronic otitis externa, right ear: Secondary | ICD-10-CM | POA: Diagnosis not present

## 2017-06-30 DIAGNOSIS — H6122 Impacted cerumen, left ear: Secondary | ICD-10-CM | POA: Diagnosis not present

## 2017-08-07 DIAGNOSIS — N6321 Unspecified lump in the left breast, upper outer quadrant: Secondary | ICD-10-CM | POA: Diagnosis not present

## 2017-08-07 DIAGNOSIS — N6002 Solitary cyst of left breast: Secondary | ICD-10-CM | POA: Diagnosis not present

## 2017-09-03 DIAGNOSIS — H26493 Other secondary cataract, bilateral: Secondary | ICD-10-CM | POA: Diagnosis not present

## 2017-09-03 DIAGNOSIS — H04123 Dry eye syndrome of bilateral lacrimal glands: Secondary | ICD-10-CM | POA: Diagnosis not present

## 2017-09-03 DIAGNOSIS — H5212 Myopia, left eye: Secondary | ICD-10-CM | POA: Diagnosis not present

## 2017-09-03 DIAGNOSIS — Z961 Presence of intraocular lens: Secondary | ICD-10-CM | POA: Diagnosis not present

## 2017-11-07 DIAGNOSIS — M8589 Other specified disorders of bone density and structure, multiple sites: Secondary | ICD-10-CM | POA: Diagnosis not present

## 2017-12-04 DIAGNOSIS — H0262 Xanthelasma of right lower eyelid: Secondary | ICD-10-CM | POA: Diagnosis not present

## 2017-12-04 DIAGNOSIS — D2339 Other benign neoplasm of skin of other parts of face: Secondary | ICD-10-CM | POA: Diagnosis not present

## 2017-12-04 DIAGNOSIS — D1721 Benign lipomatous neoplasm of skin and subcutaneous tissue of right arm: Secondary | ICD-10-CM | POA: Diagnosis not present

## 2017-12-04 DIAGNOSIS — H0265 Xanthelasma of left lower eyelid: Secondary | ICD-10-CM | POA: Diagnosis not present

## 2017-12-04 DIAGNOSIS — D1722 Benign lipomatous neoplasm of skin and subcutaneous tissue of left arm: Secondary | ICD-10-CM | POA: Diagnosis not present

## 2018-02-12 DIAGNOSIS — M1711 Unilateral primary osteoarthritis, right knee: Secondary | ICD-10-CM | POA: Diagnosis not present

## 2018-03-06 DIAGNOSIS — Z23 Encounter for immunization: Secondary | ICD-10-CM | POA: Diagnosis not present

## 2018-03-30 DIAGNOSIS — E785 Hyperlipidemia, unspecified: Secondary | ICD-10-CM | POA: Diagnosis not present

## 2018-03-30 DIAGNOSIS — Z0000 Encounter for general adult medical examination without abnormal findings: Secondary | ICD-10-CM | POA: Diagnosis not present

## 2018-03-30 DIAGNOSIS — Z131 Encounter for screening for diabetes mellitus: Secondary | ICD-10-CM | POA: Diagnosis not present

## 2018-03-30 DIAGNOSIS — Z1389 Encounter for screening for other disorder: Secondary | ICD-10-CM | POA: Diagnosis not present

## 2018-03-30 DIAGNOSIS — Z853 Personal history of malignant neoplasm of breast: Secondary | ICD-10-CM | POA: Diagnosis not present

## 2018-04-29 DIAGNOSIS — H6122 Impacted cerumen, left ear: Secondary | ICD-10-CM | POA: Diagnosis not present

## 2018-09-25 DIAGNOSIS — I493 Ventricular premature depolarization: Secondary | ICD-10-CM | POA: Diagnosis not present

## 2018-09-25 DIAGNOSIS — R030 Elevated blood-pressure reading, without diagnosis of hypertension: Secondary | ICD-10-CM | POA: Diagnosis not present

## 2018-09-25 DIAGNOSIS — R42 Dizziness and giddiness: Secondary | ICD-10-CM | POA: Diagnosis not present

## 2018-10-15 DIAGNOSIS — Z1231 Encounter for screening mammogram for malignant neoplasm of breast: Secondary | ICD-10-CM | POA: Diagnosis not present

## 2018-10-15 DIAGNOSIS — Z853 Personal history of malignant neoplasm of breast: Secondary | ICD-10-CM | POA: Diagnosis not present

## 2018-10-22 DIAGNOSIS — N6002 Solitary cyst of left breast: Secondary | ICD-10-CM | POA: Diagnosis not present

## 2018-11-30 DIAGNOSIS — I493 Ventricular premature depolarization: Secondary | ICD-10-CM | POA: Diagnosis not present

## 2018-11-30 DIAGNOSIS — D1720 Benign lipomatous neoplasm of skin and subcutaneous tissue of unspecified limb: Secondary | ICD-10-CM | POA: Diagnosis not present

## 2018-11-30 DIAGNOSIS — I10 Essential (primary) hypertension: Secondary | ICD-10-CM | POA: Diagnosis not present

## 2018-12-16 DIAGNOSIS — D1739 Benign lipomatous neoplasm of skin and subcutaneous tissue of other sites: Secondary | ICD-10-CM | POA: Diagnosis not present

## 2018-12-21 DIAGNOSIS — H04123 Dry eye syndrome of bilateral lacrimal glands: Secondary | ICD-10-CM | POA: Diagnosis not present

## 2018-12-21 DIAGNOSIS — H524 Presbyopia: Secondary | ICD-10-CM | POA: Diagnosis not present

## 2018-12-21 DIAGNOSIS — H26493 Other secondary cataract, bilateral: Secondary | ICD-10-CM | POA: Diagnosis not present

## 2018-12-24 ENCOUNTER — Ambulatory Visit: Payer: Self-pay | Admitting: General Surgery

## 2019-01-04 ENCOUNTER — Telehealth: Payer: Self-pay

## 2019-01-04 NOTE — Telephone Encounter (Signed)

## 2019-01-05 ENCOUNTER — Ambulatory Visit: Payer: Medicare PPO | Admitting: Plastic Surgery

## 2019-01-05 ENCOUNTER — Encounter: Payer: Self-pay | Admitting: Plastic Surgery

## 2019-01-05 ENCOUNTER — Other Ambulatory Visit: Payer: Self-pay

## 2019-01-05 DIAGNOSIS — H0266 Xanthelasma of left eye, unspecified eyelid: Secondary | ICD-10-CM | POA: Diagnosis not present

## 2019-01-05 DIAGNOSIS — H0263 Xanthelasma of right eye, unspecified eyelid: Secondary | ICD-10-CM | POA: Insufficient documentation

## 2019-01-05 NOTE — Progress Notes (Signed)
Patient ID: Sarah Barrett, female    DOB: 01-11-1941, 78 y.o.   MRN: [id number]   Chief Complaint  Patient presents with  . Skin Problem    The patient is an 78 year old female here for consultation of her eyelids.  The patient states that she has had lesions on her lower lids for over 5 years.  They seem to be getting larger.  Sometimes they get dry and crack.  She has prominent ones on the lower medial lids and a few small ones on her upper lids.  She has lipomas of both shoulders and these are going to be operated on by Dr. Kieth Brightly at [place] [facility] in September.  She has hyperlipidemia and hypertension.  She is otherwise very good condition for her age.  She is 4 feet 11 inches tall and weighs 133 pounds.   Review of Systems  Constitutional: Negative for activity change and appetite change.  HENT: Negative for congestion and dental problem.   Eyes: Negative.   Respiratory: Negative for chest tightness and shortness of breath.   Cardiovascular: Negative for leg swelling.  Gastrointestinal: Negative for abdominal pain.  Endocrine: Negative.   Genitourinary: Negative.   Musculoskeletal: Negative.   Skin: Positive for color change.  Psychiatric/Behavioral: Negative.     Past Medical History:  Diagnosis Date  . Cancer ([place]) [id number]   breast cancer right/left  side  . Cataract    bilateral    Past Surgical History:  Procedure Laterality Date  . ABDOMINAL HYSTERECTOMY  1995  . BACK SURGERY  2003   fusion 2003  . BREAST LUMPECTOMY  1999,2000   bilateral-with radiation  . CATARACT EXTRACTION    . COLONOSCOPY  2004   normal colon  . WISDOM TOOTH EXTRACTION        Current Outpatient Medications:  .  Calcium Carbonate-Vitamin D (CALCIUM + D PO), Take 1 capsule by mouth daily., Disp: , Rfl:  .  glucosamine-chondroitin 500-400 MG tablet, Take 1 tablet by mouth daily., Disp: , Rfl:  .  Multiple Vitamins-Minerals (MULTIVITAMIN PO), Take 1 capsule by mouth daily., Disp: , Rfl:  .   Omega-3 Fatty Acids (FISH OIL CONCENTRATE PO), Take 1 capsule by mouth daily., Disp: , Rfl:    Objective:   Vitals:   01/05/19 1049  BP: 134/68  Pulse: 70  Temp: 98.5 F (36.9 C)    Physical Exam Vitals signs and nursing note reviewed.  Constitutional:      Appearance: Normal appearance.  HENT:     Head: Normocephalic and atraumatic.  Eyes:   Cardiovascular:     Rate and Rhythm: Normal rate.  Pulmonary:     Effort: Pulmonary effort is normal.  Abdominal:     General: Abdomen is flat. There is no distension.     Tenderness: There is no abdominal tenderness.  Neurological:     General: No focal deficit present.     Mental Status: She is alert and oriented to person, place, and time.  Psychiatric:        Mood and Affect: Mood normal.        Behavior: Behavior normal.     Assessment & Plan:     ICD-10-CM   1. Xanthelasma of eyelid, bilateral  H02.63    H02.66     Recommend excision of bilateral lower lid xanthelasmas and upper lid to small areas.   Pictures were obtained of the patient and placed in the chart with the patient's or guardian's permission.  [place], DO

## 2019-01-26 DIAGNOSIS — M1711 Unilateral primary osteoarthritis, right knee: Secondary | ICD-10-CM | POA: Diagnosis not present

## 2019-01-26 DIAGNOSIS — M545 Low back pain: Secondary | ICD-10-CM | POA: Diagnosis not present

## 2019-02-01 ENCOUNTER — Other Ambulatory Visit: Payer: Self-pay

## 2019-02-01 ENCOUNTER — Other Ambulatory Visit (HOSPITAL_COMMUNITY)
Admission: RE | Admit: 2019-02-01 | Discharge: 2019-02-01 | Disposition: A | Discharge status: Home / Self Care | Payer: Medicare HMO | Source: Ambulatory Visit | Attending: General Surgery | Admitting: General Surgery

## 2019-02-01 ENCOUNTER — Encounter (HOSPITAL_BASED_OUTPATIENT_CLINIC_OR_DEPARTMENT_OTHER): Payer: Self-pay | Admitting: *Deleted

## 2019-02-01 DIAGNOSIS — Z20828 Contact with and (suspected) exposure to other viral communicable diseases: Secondary | ICD-10-CM | POA: Insufficient documentation

## 2019-02-01 DIAGNOSIS — Z01812 Encounter for preprocedural laboratory examination: Secondary | ICD-10-CM | POA: Diagnosis not present

## 2019-02-01 NOTE — Progress Notes (Addendum)
Spoke w/ pt via phone for pre-op interview.  Npo after mn.  Arrive at 0530.  Need istat and ekg.  Pt had covid test done 02-01-2019.    Pt was taking metoprolol for hypertension until 08/ 2020 pt stated stopped taking own her own , her pcp is unaware, stated her blood pressure is better. Which she had been monitoring at home, however, she has not checked bp in few weeks.  Advised pt to call her pcp office tomorrow to let her know about stopping her medication and get recommendation .  Asked pt call back after she has heard back from her pcp, dr Margaretha Sheffield griffin, pt verbalized understanding.  ADDENDUM:  Pt called back today via phone.  Reported she called her pcp, dr Margaretha Sheffield griffin, was advised to check bp 3 times yesterday and call the office back today.  Pt stated she call dr Margaretha Sheffield griffiin back with bp's and was advised to not take metoprolol and will follow-up with pt on her next appt feb 2021. (bp's were 132-142/  71-77). Reviewed chart w/ anesthesia, Konrad Felix PA, ok to proceed.

## 2019-02-02 LAB — NOVEL CORONAVIRUS, NAA (HOSP ORDER, SEND-OUT TO REF LAB; TAT 18-24 HRS): SARS-CoV-2, NAA: NOT DETECTED

## 2019-02-04 ENCOUNTER — Encounter (HOSPITAL_BASED_OUTPATIENT_CLINIC_OR_DEPARTMENT_OTHER)
Admission: RE | Disposition: A | Discharge status: Home / Self Care | Payer: Self-pay | Source: Home / Self Care | Attending: General Surgery

## 2019-02-04 ENCOUNTER — Ambulatory Visit (HOSPITAL_BASED_OUTPATIENT_CLINIC_OR_DEPARTMENT_OTHER): Payer: Medicare HMO | Admitting: Certified Registered"

## 2019-02-04 ENCOUNTER — Encounter (HOSPITAL_BASED_OUTPATIENT_CLINIC_OR_DEPARTMENT_OTHER): Payer: Self-pay

## 2019-02-04 ENCOUNTER — Ambulatory Visit (HOSPITAL_BASED_OUTPATIENT_CLINIC_OR_DEPARTMENT_OTHER)
Admission: RE | Admit: 2019-02-04 | Discharge: 2019-02-04 | Disposition: A | Discharge status: Home / Self Care | Payer: Medicare HMO | Attending: General Surgery | Admitting: General Surgery

## 2019-02-04 DIAGNOSIS — Z853 Personal history of malignant neoplasm of breast: Secondary | ICD-10-CM | POA: Diagnosis not present

## 2019-02-04 DIAGNOSIS — Z981 Arthrodesis status: Secondary | ICD-10-CM | POA: Insufficient documentation

## 2019-02-04 DIAGNOSIS — Z885 Allergy status to narcotic agent status: Secondary | ICD-10-CM | POA: Insufficient documentation

## 2019-02-04 DIAGNOSIS — Z9841 Cataract extraction status, right eye: Secondary | ICD-10-CM | POA: Insufficient documentation

## 2019-02-04 DIAGNOSIS — Z8049 Family history of malignant neoplasm of other genital organs: Secondary | ICD-10-CM | POA: Diagnosis not present

## 2019-02-04 DIAGNOSIS — M1990 Unspecified osteoarthritis, unspecified site: Secondary | ICD-10-CM | POA: Insufficient documentation

## 2019-02-04 DIAGNOSIS — D1721 Benign lipomatous neoplasm of skin and subcutaneous tissue of right arm: Secondary | ICD-10-CM | POA: Insufficient documentation

## 2019-02-04 DIAGNOSIS — H9193 Unspecified hearing loss, bilateral: Secondary | ICD-10-CM | POA: Insufficient documentation

## 2019-02-04 DIAGNOSIS — Z79899 Other long term (current) drug therapy: Secondary | ICD-10-CM | POA: Diagnosis not present

## 2019-02-04 DIAGNOSIS — Z809 Family history of malignant neoplasm, unspecified: Secondary | ICD-10-CM | POA: Insufficient documentation

## 2019-02-04 DIAGNOSIS — D1722 Benign lipomatous neoplasm of skin and subcutaneous tissue of left arm: Secondary | ICD-10-CM | POA: Insufficient documentation

## 2019-02-04 DIAGNOSIS — Z9842 Cataract extraction status, left eye: Secondary | ICD-10-CM | POA: Diagnosis not present

## 2019-02-04 DIAGNOSIS — Z803 Family history of malignant neoplasm of breast: Secondary | ICD-10-CM | POA: Insufficient documentation

## 2019-02-04 DIAGNOSIS — Z961 Presence of intraocular lens: Secondary | ICD-10-CM | POA: Diagnosis not present

## 2019-02-04 DIAGNOSIS — Z90710 Acquired absence of both cervix and uterus: Secondary | ICD-10-CM | POA: Insufficient documentation

## 2019-02-04 DIAGNOSIS — I10 Essential (primary) hypertension: Secondary | ICD-10-CM | POA: Insufficient documentation

## 2019-02-04 DIAGNOSIS — R2232 Localized swelling, mass and lump, left upper limb: Secondary | ICD-10-CM | POA: Diagnosis not present

## 2019-02-04 DIAGNOSIS — D1739 Benign lipomatous neoplasm of skin and subcutaneous tissue of other sites: Secondary | ICD-10-CM | POA: Diagnosis not present

## 2019-02-04 HISTORY — DX: Essential (primary) hypertension: I10

## 2019-02-04 HISTORY — PX: MASS EXCISION: SHX2000

## 2019-02-04 HISTORY — DX: Presence of external hearing-aid: Z97.4

## 2019-02-04 HISTORY — DX: Personal history of malignant neoplasm of breast: Z85.3

## 2019-02-04 HISTORY — DX: Presence of dental prosthetic device (complete) (partial): Z97.2

## 2019-02-04 HISTORY — DX: Localized swelling, mass and lump, unspecified upper limb: R22.30

## 2019-02-04 HISTORY — DX: Unspecified osteoarthritis, unspecified site: M19.90

## 2019-02-04 HISTORY — DX: Localized swelling, mass and lump, right upper limb: R22.31

## 2019-02-04 LAB — POCT I-STAT, CHEM 8
BUN: 15 mg/dL (ref 8–23)
Calcium, Ion: 1.29 mmol/L (ref 1.15–1.40)
Chloride: 103 mmol/L (ref 98–111)
Creatinine, Ser: 0.6 mg/dL (ref 0.44–1.00)
Glucose, Bld: 90 mg/dL (ref 70–99)
HCT: 47 % — ABNORMAL HIGH (ref 36.0–46.0)
Hemoglobin: 16 g/dL — ABNORMAL HIGH (ref 12.0–15.0)
Potassium: 4 mmol/L (ref 3.5–5.1)
Sodium: 139 mmol/L (ref 135–145)
TCO2: 25 mmol/L (ref 22–32)

## 2019-02-04 SURGERY — EXCISION MASS
Anesthesia: General

## 2019-02-04 MED ORDER — ACETAMINOPHEN 10 MG/ML IV SOLN
INTRAVENOUS | Status: AC
  Filled 2019-02-04: qty 100

## 2019-02-04 MED ORDER — CEFAZOLIN SODIUM-DEXTROSE 2-4 GM/100ML-% IV SOLN
2.0000 g | INTRAVENOUS | Status: AC
  Administered 2019-02-04: 2 g via INTRAVENOUS
  Filled 2019-02-04: qty 100

## 2019-02-04 MED ORDER — FENTANYL CITRATE (PF) 100 MCG/2ML IJ SOLN
INTRAMUSCULAR | Status: DC | PRN
  Administered 2019-02-04 (×2): 25 ug via INTRAVENOUS

## 2019-02-04 MED ORDER — CHLORHEXIDINE GLUCONATE CLOTH 2 % EX PADS
6.0000 | MEDICATED_PAD | Freq: Once | CUTANEOUS | Status: DC
  Filled 2019-02-04: qty 6

## 2019-02-04 MED ORDER — FENTANYL CITRATE (PF) 100 MCG/2ML IJ SOLN
INTRAMUSCULAR | Status: AC
  Filled 2019-02-04: qty 2

## 2019-02-04 MED ORDER — CEFAZOLIN SODIUM-DEXTROSE 2-4 GM/100ML-% IV SOLN
INTRAVENOUS | Status: AC
  Filled 2019-02-04: qty 100

## 2019-02-04 MED ORDER — DEXAMETHASONE SODIUM PHOSPHATE 10 MG/ML IJ SOLN
INTRAMUSCULAR | Status: DC | PRN
  Administered 2019-02-04: 5 mg via INTRAVENOUS

## 2019-02-04 MED ORDER — PROPOFOL 10 MG/ML IV BOLUS
INTRAVENOUS | Status: AC
  Filled 2019-02-04: qty 40

## 2019-02-04 MED ORDER — LACTATED RINGERS IV SOLN
INTRAVENOUS | Status: DC
  Administered 2019-02-04: 06:00:00 via INTRAVENOUS
  Filled 2019-02-04: qty 1000

## 2019-02-04 MED ORDER — DEXAMETHASONE SODIUM PHOSPHATE 10 MG/ML IJ SOLN
INTRAMUSCULAR | Status: AC
  Filled 2019-02-04: qty 1

## 2019-02-04 MED ORDER — EPHEDRINE 5 MG/ML INJ
INTRAVENOUS | Status: AC
  Filled 2019-02-04: qty 10

## 2019-02-04 MED ORDER — BUPIVACAINE HCL 0.5 % IJ SOLN
INTRAMUSCULAR | Status: DC | PRN
  Administered 2019-02-04: 26 mL

## 2019-02-04 MED ORDER — PROPOFOL 10 MG/ML IV BOLUS
INTRAVENOUS | Status: DC | PRN
  Administered 2019-02-04: 150 mg via INTRAVENOUS

## 2019-02-04 MED ORDER — ONDANSETRON HCL 4 MG/2ML IJ SOLN
INTRAMUSCULAR | Status: DC | PRN
  Administered 2019-02-04: 4 mg via INTRAVENOUS

## 2019-02-04 MED ORDER — LIDOCAINE 2% (20 MG/ML) 5 ML SYRINGE
INTRAMUSCULAR | Status: DC | PRN
  Administered 2019-02-04: 60 mg via INTRAVENOUS

## 2019-02-04 MED ORDER — ACETAMINOPHEN 10 MG/ML IV SOLN
1000.0000 mg | Freq: Once | INTRAVENOUS | Status: AC
  Administered 2019-02-04: 1000 mg via INTRAVENOUS
  Filled 2019-02-04: qty 100

## 2019-02-04 MED ORDER — LIDOCAINE 2% (20 MG/ML) 5 ML SYRINGE
INTRAMUSCULAR | Status: AC
  Filled 2019-02-04: qty 5

## 2019-02-04 MED ORDER — ONDANSETRON HCL 4 MG/2ML IJ SOLN
INTRAMUSCULAR | Status: AC
  Filled 2019-02-04: qty 2

## 2019-02-04 MED ORDER — PROMETHAZINE HCL 25 MG/ML IJ SOLN
6.2500 mg | INTRAMUSCULAR | Status: DC | PRN
  Filled 2019-02-04: qty 1

## 2019-02-04 MED ORDER — EPHEDRINE SULFATE-NACL 50-0.9 MG/10ML-% IV SOSY
PREFILLED_SYRINGE | INTRAVENOUS | Status: DC | PRN
  Administered 2019-02-04 (×2): 10 mg via INTRAVENOUS

## 2019-02-04 MED ORDER — FENTANYL CITRATE (PF) 100 MCG/2ML IJ SOLN
25.0000 ug | INTRAMUSCULAR | Status: DC | PRN
  Administered 2019-02-04 (×2): 25 ug via INTRAVENOUS
  Filled 2019-02-04: qty 1

## 2019-02-04 SURGICAL SUPPLY — 55 items
BENZOIN TINCTURE PRP APPL 2/3 (GAUZE/BANDAGES/DRESSINGS) IMPLANT
BLADE CLIPPER SENSICLIP SURGIC (BLADE) IMPLANT
BLADE HEX COATED 2.75 (ELECTRODE) ×3 IMPLANT
BLADE SURG 15 STRL LF DISP TIS (BLADE) ×1 IMPLANT
BLADE SURG 15 STRL SS (BLADE) ×2
BNDG GAUZE ELAST 4 BULKY (GAUZE/BANDAGES/DRESSINGS) IMPLANT
CHLORAPREP W/TINT 26ML (MISCELLANEOUS) ×5 IMPLANT
CLOSURE WOUND 1/2 X4 (GAUZE/BANDAGES/DRESSINGS)
COVER BACK TABLE 60X90IN (DRAPES) ×3 IMPLANT
COVER MAYO STAND STRL (DRAPES) ×3 IMPLANT
COVER WAND RF STERILE (DRAPES) ×4 IMPLANT
DECANTER SPIKE VIAL GLASS SM (MISCELLANEOUS) IMPLANT
DERMABOND ADVANCED (GAUZE/BANDAGES/DRESSINGS) ×4
DERMABOND ADVANCED .7 DNX12 (GAUZE/BANDAGES/DRESSINGS) IMPLANT
DRAIN PENROSE 18X1/2 LTX STRL (DRAIN) IMPLANT
DRAIN PENROSE 18X1/4 LTX STRL (WOUND CARE) IMPLANT
DRAPE HALF SHEET 40X57 (DRAPES) ×6 IMPLANT
DRAPE LAPAROTOMY 100X72 PEDS (DRAPES) ×1 IMPLANT
DRAPE ORTHO SPLIT 77X108 STRL (DRAPES) ×8
DRAPE SURG ORHT 6 SPLT 77X108 (DRAPES) IMPLANT
DRAPE UTILITY XL STRL (DRAPES) ×1 IMPLANT
DRSG TEGADERM 4X4.75 (GAUZE/BANDAGES/DRESSINGS) IMPLANT
ELECT REM PT RETURN 9FT ADLT (ELECTROSURGICAL) ×3
ELECTRODE REM PT RTRN 9FT ADLT (ELECTROSURGICAL) ×1 IMPLANT
GAUZE SPONGE 4X4 12PLY STRL (GAUZE/BANDAGES/DRESSINGS) IMPLANT
GLOVE BIOGEL PI IND STRL 7.0 (GLOVE) ×1 IMPLANT
GLOVE BIOGEL PI INDICATOR 7.0 (GLOVE) ×2
GLOVE SURG SS PI 7.0 STRL IVOR (GLOVE) ×5 IMPLANT
GOWN STRL REUS W/TWL LRG LVL3 (GOWN DISPOSABLE) ×7 IMPLANT
GOWN STRL REUS W/TWL XL LVL3 (GOWN DISPOSABLE) ×3 IMPLANT
KIT TURNOVER CYSTO (KITS) ×3 IMPLANT
NDL HYPO 25X1 1.5 SAFETY (NEEDLE) ×1 IMPLANT
NEEDLE HYPO 25X1 1.5 SAFETY (NEEDLE) ×3 IMPLANT
NS IRRIG 500ML POUR BTL (IV SOLUTION) IMPLANT
PACK BASIN DAY SURGERY FS (CUSTOM PROCEDURE TRAY) ×3 IMPLANT
PENCIL BUTTON HOLSTER BLD 10FT (ELECTRODE) ×3 IMPLANT
SPONGE LAP 4X18 RFD (DISPOSABLE) IMPLANT
STRIP CLOSURE SKIN 1/2X4 (GAUZE/BANDAGES/DRESSINGS) IMPLANT
SUCTION FRAZIER TIP 10 FR DISP (SUCTIONS) IMPLANT
SUT MNCRL AB 4-0 PS2 18 (SUTURE) ×4 IMPLANT
SUT SILK 3 0 TIES 17X18 (SUTURE)
SUT SILK 3-0 18XBRD TIE BLK (SUTURE) IMPLANT
SUT VIC AB 2-0 SH 27 (SUTURE)
SUT VIC AB 2-0 SH 27XBRD (SUTURE) IMPLANT
SUT VIC AB 3-0 SH 27 (SUTURE) ×4
SUT VIC AB 3-0 SH 27X BRD (SUTURE) IMPLANT
SUT VIC AB 3-0 SH 8-18 (SUTURE) IMPLANT
SWAB COLLECTION DEVICE MRSA (MISCELLANEOUS) IMPLANT
SWAB CULTURE ESWAB REG 1ML (MISCELLANEOUS) IMPLANT
SYR BULB IRRIGATION 50ML (SYRINGE) ×2 IMPLANT
SYR CONTROL 10ML LL (SYRINGE) ×3 IMPLANT
TOWEL OR 17X26 10 PK STRL BLUE (TOWEL DISPOSABLE) ×3 IMPLANT
TUBE CONNECTING 12'X1/4 (SUCTIONS) ×1
TUBE CONNECTING 12X1/4 (SUCTIONS) ×2 IMPLANT
YANKAUER SUCT BULB TIP NO VENT (SUCTIONS) ×2 IMPLANT

## 2019-02-04 NOTE — Op Note (Signed)
Preoperative diagnosis: right shoulder mass, left arm mass  Postoperative diagnosis: same   Procedure: excision of 6cm right shoulder mass superficial to the deltoid fascia, excision of 4cm left arm mass not involving fascia  Surgeon: Gurney Maxin, M.D.  Asst: Jeralyn Bennett PA student  Anesthesia: general  Indications for procedure: Sarah Barrett is a 78 y.o. year old female with symptoms of enlarging subcutaneous masses of both upper extremities.  Description of procedure: The patient was brought into the operative suite. Anesthesia was administered with General LMA anesthesia. WHO checklist was applied. The patient was then placed in supine position. The area was prepped and draped in the usual sterile fashion.  Next, a longitudinal incision was made over the superior aspect of the right shoulder.  Cautery was used to dissect through the dermis and fatty tissue was identified the mass was dissected free of the dermis and subcutaneous tissue with cautery and blunt dissection.  Mass was in direct contact with the fascia of the deltoid muscle but did not invade the deltoid muscle.  Mass was removed in its entirety.  Hemostasis was then applied with cautery.  Additional skin was removed to improve cosmesis.  The deep dermal space was closed with interrupted 3-0 Vicryl.  Skin was then closed with a running 4-0 Monocryl subcuticular stitch.  Dermabond was put in place for dressing.  Next, a longitudinal incision was made in the left anterior forearm directly overlying the mass.  Cautery was used to dissect down through the subcutaneous tissue and the mass was identified.  Mass was completely dissected free of surrounding tissue.  Mass was in connection to the cephalic vein which was divided and then ligated with 3-0 Vicryl tie.  Masses completely removed.  The wound was irrigated.  Additional hemostasis was applied with cautery.  Deep dermal space was closed with 3-0 Vicryl.  Skin was closed with a  running 4-0 Monocryl subcuticular stitch.  Dermabond was put in place for dressing.  All counts were correct.  Patient awoke from anesthesia brought to PACU in stable condition.  Findings: 6cm mass right shoulder, 4cm fatty mass left arm  Specimen: right shoulder lipoma, left arm mass  Implant: none   Blood loss: 20 ml  Local anesthesia: 26 ml marcaine  Complications: none  Gurney Maxin, M.D. General, Bariatric, & Minimally Invasive Surgery [facility]

## 2019-02-04 NOTE — Anesthesia Postprocedure Evaluation (Signed)
Anesthesia Post Note  Patient: Sarah Barrett  Procedure(s) Performed: EXCISION OF RIGHT SHOULDER SKIN MASS AND LEFT ARM SKIN MASS (N/A )     Patient location during evaluation: PACU Anesthesia Type: General Level of consciousness: awake and alert Pain management: pain level controlled Vital Signs Assessment: post-procedure vital signs reviewed and stable Respiratory status: spontaneous breathing, nonlabored ventilation, respiratory function stable and patient connected to nasal cannula oxygen Cardiovascular status: blood pressure returned to baseline and stable Postop Assessment: no apparent nausea or vomiting Anesthetic complications: no    Last Vitals:  Vitals:   02/04/19 0915 02/04/19 0930  BP: (!) 144/83 139/86  Pulse: 83 76  Resp: 14 14  Temp:    SpO2: 98% 93%    Last Pain:  Vitals:   02/04/19 0930  TempSrc:   PainSc: 3                  Hennesy Sobalvarro S

## 2019-02-04 NOTE — Transfer of Care (Signed)
Immediate Anesthesia Transfer of Care Note  Patient: Sarah Barrett  Procedure(s) Performed: Procedure(s) (LRB): EXCISION OF RIGHT SHOULDER SKIN MASS AND LEFT ARM SKIN MASS (N/A)  Patient Location: PACU  Anesthesia Type: General  Level of Consciousness: awake, oriented, sedated and patient cooperative  Airway & Oxygen Therapy: Patient Spontanous Breathing and Patient connected to face mask oxygen  Post-op Assessment: Report given to PACU RN and Post -op Vital signs reviewed and stable  Post vital signs: Reviewed and stable  Complications: No apparent anesthesia complications  Last Vitals:  Vitals Value Taken Time  BP 147/75 02/04/19 0846  Temp    Pulse 94 02/04/19 0848  Resp 15 02/04/19 0848  SpO2 100 % 02/04/19 0848  Vitals shown include unvalidated device data.  Last Pain:  Vitals:   02/04/19 0556  TempSrc: Oral  PainSc: 0-No pain      Patients Stated Pain Goal: 5 (02/04/19 0556)

## 2019-02-04 NOTE — Anesthesia Procedure Notes (Signed)
Procedure Name: LMA Insertion Date/Time: 02/04/2019 7:28 AM Performed by: Suan Halter, CRNA Pre-anesthesia Checklist: Patient identified, Emergency Drugs available, Suction available and Patient being monitored Patient Re-evaluated:Patient Re-evaluated prior to induction Oxygen Delivery Method: Circle system utilized Preoxygenation: Pre-oxygenation with 100% oxygen Induction Type: IV induction Ventilation: Mask ventilation without difficulty LMA: LMA inserted LMA Size: 4.0 Number of attempts: 1 Airway Equipment and Method: Bite block Placement Confirmation: positive ETCO2 Tube secured with: Tape Dental Injury: Teeth and Oropharynx as per pre-operative assessment

## 2019-02-04 NOTE — Discharge Instructions (Signed)

## 2019-02-04 NOTE — Anesthesia Preprocedure Evaluation (Signed)
Anesthesia Evaluation  Patient identified by MRN, date of birth, ID band Patient awake    Reviewed: Allergy & Precautions, NPO status , Patient's Chart, lab work & pertinent test results  Airway Mallampati: II  TM Distance: >3 FB Neck ROM: Full    Dental no notable dental hx.    Pulmonary neg pulmonary ROS,    Pulmonary exam normal breath sounds clear to auscultation       Cardiovascular hypertension, Normal cardiovascular exam Rhythm:Regular Rate:Normal     Neuro/Psych negative neurological ROS  negative psych ROS   GI/Hepatic negative GI ROS, Neg liver ROS,   Endo/Other  negative endocrine ROS  Renal/GU negative Renal ROS  negative genitourinary   Musculoskeletal negative musculoskeletal ROS (+)   Abdominal   Peds negative pediatric ROS (+)  Hematology negative hematology ROS (+)   Anesthesia Other Findings   Reproductive/Obstetrics negative OB ROS                             Anesthesia Physical Anesthesia Plan  ASA: II  Anesthesia Plan: General   Post-op Pain Management:    Induction: Intravenous  PONV Risk Score and Plan: 3 and Ondansetron, Dexamethasone and Treatment may vary due to age or medical condition  Airway Management Planned: LMA and Oral ETT  Additional Equipment:   Intra-op Plan:   Post-operative Plan: Extubation in OR  Informed Consent: I have reviewed the patients History and Physical, chart, labs and discussed the procedure including the risks, benefits and alternatives for the proposed anesthesia with the patient or authorized representative who has indicated his/her understanding and acceptance.     Dental advisory given  Plan Discussed with: CRNA and Surgeon  Anesthesia Plan Comments:         Anesthesia Quick Evaluation

## 2019-02-04 NOTE — H&P (Signed)
Sarah Barrett is an 78 y.o. female.   Chief Complaint: skin masses HPI: 78 yo female with slowly growing masses over right shoulder and left arm. They are uncomfortable and she would like them removed.  Past Medical History:  Diagnosis Date  . Arm mass, right   . History of bilateral breast cancer released from oncologist-- dr Truddie Coco 2010, (02-01-2019 per pt no recurrence)   dx right side 1999 s/p  breast lumpectomy and IMRT;  dx left side 2000 s/p breast lumpectomy and completed IMRT 2000  . Hypertension    02-01-2019-- per pt was given medication by pcp, took for 3 months, last taken 08 /2020, pt states blood pressure better montior's at home but has not checked it for few weeks  . OA (osteoarthritis)   . Shoulder mass    right  . Wears hearing aid in both ears   . Wears partial dentures    upper    Past Surgical History:  Procedure Laterality Date  . BREAST LUMPECTOMY Bilateral right 1999;  left 04/ 2000  . CATARACT EXTRACTION W/ INTRAOCULAR LENS  IMPLANT, BILATERAL  yrs ago  . KNEE ARTHROSCOPY W/ MENISCAL REPAIR Right 2018  . LUMBAR FUSION  07-07-2001   dr Carloyn Manner @[facility]    L5 --S1   . REMOVAL OF URINARY SLING  12-20-2003   dr Jeffie Pollock @[facility]   . TOTAL ABDOMINAL HYSTERECTOMY W/ BILATERAL SALPINGOOPHORECTOMY  1995  . TRANSOBTURATOR SLING  08/2003  . WISDOM TOOTH EXTRACTION      Family History  Problem Relation Age of Onset  . Breast cancer Mother   . Breast cancer Maternal Grandmother   . Cancer Sister   . Cervical cancer Maternal Aunt   . Colon cancer Neg Hx   . Rectal cancer Neg Hx   . Stomach cancer Neg Hx    Social History:  reports that she has never smoked. She has never used smokeless tobacco. She reports that she does not drink alcohol or use drugs.  Allergies:  Allergies  Allergen Reactions  . Morphine And Related Other (See Comments)    Hallucinations, doesn't like how it made her feel    Medications Prior to Admission  Medication Sig Dispense Refill  . Calcium  Carbonate-Vitamin D (CALCIUM + D PO) Take 1 capsule by mouth daily.    . hydroxypropyl methylcellulose / hypromellose (ISOPTO TEARS / GONIOVISC) 2.5 % ophthalmic solution Place 1 drop into both eyes as needed for dry eyes.    . metoprolol succinate (TOPROL-XL) 25 MG 24 hr tablet Take 25 mg by mouth daily.    . Multiple Vitamins-Minerals (MULTIVITAMIN PO) Take 1 capsule by mouth daily.    . Omega-3 Fatty Acids (FISH OIL CONCENTRATE PO) Take 1 capsule by mouth daily.      Results for orders placed or performed during the hospital encounter of 02/04/19 (from the past 48 hour(s))  I-STAT, chem 8     Status: Abnormal   Collection Time: 02/04/19  6:14 AM  Result Value Ref Range   Sodium 139 135 - 145 mmol/L   Potassium 4.0 3.5 - 5.1 mmol/L   Chloride 103 98 - 111 mmol/L   BUN 15 8 - 23 mg/dL   Creatinine, Ser 0.60 0.44 - 1.00 mg/dL   Glucose, Bld 90 70 - 99 mg/dL   Calcium, Ion 1.29 1.15 - 1.40 mmol/L   TCO2 25 22 - 32 mmol/L   Hemoglobin 16.0 (H) 12.0 - 15.0 g/dL   HCT 47.0 (H) 36.0 - 46.0 %  No results found.  Review of Systems  Constitutional: Negative for chills and fever.  HENT: Negative for hearing loss.   Eyes: Negative for blurred vision and double vision.  Respiratory: Negative for cough and hemoptysis.   Cardiovascular: Negative for chest pain and palpitations.  Gastrointestinal: Negative for abdominal pain, nausea and vomiting.  Genitourinary: Negative for dysuria and urgency.  Musculoskeletal: Negative for myalgias and neck pain.  Skin: Negative for itching and rash.  Neurological: Negative for dizziness, tingling and headaches.  Endo/Heme/Allergies: Does not bruise/bleed easily.  Psychiatric/Behavioral: Negative for depression and suicidal ideas.    Blood pressure (!) 141/79, pulse 67, temperature 97.9 F (36.6 C), temperature source Oral, resp. rate 16, height 4' 11.5" (1.511 m), weight 61.8 kg, SpO2 100 %. Physical Exam  Vitals reviewed. Constitutional: She is  oriented to person, place, and time. She appears well-developed and well-nourished.  HENT:  Head: Normocephalic and atraumatic.  Eyes: Pupils are equal, round, and reactive to light. Conjunctivae and EOM are normal.  Neck: Normal range of motion. Neck supple.  Cardiovascular: Normal rate and regular rhythm.  Respiratory: Effort normal and breath sounds normal.  GI: Soft. Bowel sounds are normal. She exhibits no distension. There is no abdominal tenderness.  Musculoskeletal: Normal range of motion.  Neurological: She is alert and oriented to person, place, and time.  Skin: Skin is warm and dry.  5cm superior shoulder mass, 4cm anterior proximal arm mass  Psychiatric: She has a normal mood and affect. Her behavior is normal.     Assessment/Plan 78 yo female with right shoulder and left arm subcutaneous masses. -OR for excision -planned outpatient procedure  Mickeal Skinner, MD 02/04/2019, 7:18 AM

## 2019-02-05 ENCOUNTER — Encounter (HOSPITAL_BASED_OUTPATIENT_CLINIC_OR_DEPARTMENT_OTHER): Payer: Self-pay | Admitting: General Surgery

## 2019-02-05 LAB — SURGICAL PATHOLOGY

## 2019-03-01 ENCOUNTER — Encounter: Payer: Self-pay | Admitting: Nurse Practitioner

## 2019-03-01 ENCOUNTER — Other Ambulatory Visit: Payer: Self-pay

## 2019-03-01 ENCOUNTER — Ambulatory Visit (INDEPENDENT_AMBULATORY_CARE_PROVIDER_SITE_OTHER): Payer: Medicare HMO | Admitting: Nurse Practitioner

## 2019-03-01 VITALS — BP 166/71 | HR 73 | Temp 96.6°F | Ht 59.0 in | Wt 138.2 lb

## 2019-03-01 DIAGNOSIS — H0266 Xanthelasma of left eye, unspecified eyelid: Secondary | ICD-10-CM

## 2019-03-01 DIAGNOSIS — H0263 Xanthelasma of right eye, unspecified eyelid: Secondary | ICD-10-CM

## 2019-03-01 NOTE — Progress Notes (Signed)
History of Present Illness: Sarah Barrett is a 78 y.o.  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 She presents for preoperative evaluation for upcoming procedure, excision of four lid xanthelasmas, scheduled for 03/11/19 with Dr. Marla Roe. Patient states the lesions have been present for several years. Patient states she is very worried about the area "looking worse" after surgery. Patient underwent an excision of two lipomas 3 weeks ago by another provider. Patient states she is not happy with the result and is now very hesitant to undergo any other surgery. Patient requests to speak directly to Dr. Marla Roe about surgical technique and risks. Patient would also like to discuss removing a 4 mm x 5 mm mole on her back.   Past medical history is significant for breast cancer of both breasts 20 years ago, HTN, and HLD. Previous surgical history includes; excision of lipomas on bilateral arms on 02/04/19. Patient states she had hallucinations after receiving morphine after recent surgery. Patient denies any other adverse reactions to anesthesia. Patient denies any personal or family history of blood clots.     Past Medical History: Allergies: Allergies  Allergen Reactions  . Morphine And Related Other (See Comments)    Hallucinations, doesn't like how it made her feel    Current Medications:  Current Outpatient Medications:  .  Calcium Carbonate-Vitamin D (CALCIUM + D PO), Take 1 capsule by mouth daily., Disp: , Rfl:  .  hydroxypropyl methylcellulose / hypromellose (ISOPTO TEARS / GONIOVISC) 2.5 % ophthalmic solution, Place 1 drop into both eyes as needed for dry eyes., Disp: , Rfl:  .  Multiple Vitamins-Minerals (MULTIVITAMIN PO), Take 1 capsule by mouth daily., Disp: , Rfl:  .  Omega-3 Fatty Acids (FISH OIL CONCENTRATE PO), Take 1 capsule by mouth daily., Disp: , Rfl:   Past Medical Problems: Past Medical History:  Diagnosis Date  . Arm mass, right   . History of  bilateral breast cancer released from oncologist-- dr Truddie Coco 2010, (02-01-2019 per pt no recurrence)   dx right side 1999 s/p  breast lumpectomy and IMRT;  dx left side 2000 s/p breast lumpectomy and completed IMRT 2000  . Hypertension    02-01-2019-- per pt was given medication by pcp, took for 3 months, last taken 08 /2020, pt states blood pressure better montior's at home but has not checked it for few weeks  . OA (osteoarthritis)   . Shoulder mass    right  . Wears hearing aid in both ears   . Wears partial dentures    upper    Past Surgical History: Past Surgical History:  Procedure Laterality Date  . BREAST LUMPECTOMY Bilateral right 1999;  left 04/ 2000  . CATARACT EXTRACTION W/ INTRAOCULAR LENS  IMPLANT, BILATERAL  yrs ago  . KNEE ARTHROSCOPY W/ MENISCAL REPAIR Right 2018  . LUMBAR FUSION  07-07-2001   dr Carloyn Manner @[facility]    L5 --S1   . MASS EXCISION N/A 02/04/2019   Procedure: EXCISION OF RIGHT SHOULDER SKIN MASS AND LEFT ARM SKIN MASS;  Surgeon: Kieth Brightly, Arta Bruce, MD;  Location: [facility];  Service: General;  Laterality: N/A;  . REMOVAL OF URINARY SLING  12-20-2003   dr Jeffie Pollock @[facility]   . TOTAL ABDOMINAL HYSTERECTOMY W/ BILATERAL SALPINGOOPHORECTOMY  1995  . TRANSOBTURATOR SLING  08/2003  . WISDOM TOOTH EXTRACTION      The patient has had anesthesia or sedation in the past.   The patient HAS had problems with anesthesia. Reaction to  morphine The patient does NOT have a family history of anesthesia problems.    Social History: Social History   Socioeconomic History  . Marital status: Married    Spouse name: Not on file  . Number of children: Not on file  . Years of education: Not on file  . Highest education level: Not on file  Occupational History  . Not on file  Social Needs  . Financial resource strain: Not on file  . Food insecurity    Worry: Not on file    Inability: Not on file  . Transportation needs    Medical: Not on file    Non-medical: Not on  file  Tobacco Use  . Smoking status: Never Smoker  . Smokeless tobacco: Never Used  Substance and Sexual Activity  . Alcohol use: No  . Drug use: Never  . Sexual activity: Not on file  Lifestyle  . Physical activity    Days per week: Not on file    Minutes per session: Not on file  . Stress: Not on file  Relationships  . Social Herbalist on phone: Not on file    Gets together: Not on file    Attends religious service: Not on file    Active member of club or organization: Not on file    Attends meetings of clubs or organizations: Not on file    Relationship status: Not on file  . Intimate partner violence    Fear of current or ex partner: Not on file    Emotionally abused: Not on file    Physically abused: Not on file    Forced sexual activity: Not on file  Other Topics Concern  . Not on file  Social History Narrative  . Not on file    Family History: Family History  Problem Relation Age of Onset  . Breast cancer Mother   . Breast cancer Maternal Grandmother   . Cancer Sister   . Cervical cancer Maternal Aunt   . Colon cancer Neg Hx   . Rectal cancer Neg Hx   . Stomach cancer Neg Hx     Review of Systems: General ROS: negative Dermatological ROS: mole on back Cardiovascular ROS: no chest pain or dyspnea on exertion ENT ROS: wears hearing aids and upper dentures Gastrointestinal ROS: no abdominal pain, change in bowel habits, or black or bloody stools  Physical Exam: Vital Signs BP (!) 166/71 (BP Location: Left Arm, Patient Position: Sitting, Cuff Size: Normal)   Pulse 73   Temp (!) 96.6 F (35.9 C) (Temporal)   Ht 4\' 11"  (1.499 m)   Wt 138 lb 3.2 oz (62.7 kg)   SpO2 98%   BMI 27.91 kg/m  General: awake, alert, no acute distress HEENT: upper dentures, tan colored slightly raised lesion immediately inferior to bilateral lower eyelids and small lesions on bilateral upper lids Neck: supple, full ROM Chest: symmetrical rise and fall Cardiac:  regular rate and rhythm, +2 radial pulses bilaterally, +2 pedal pulses bilaterally Abdomen: soft, non-distended, non-tender Musculoskeletal: MAE x4 Neuro: A&Ox3 Skin: 4 mm x 5 mm skin colored raised mole on mid back along spine  Assessment: 78 y.o.female  with a history of bilateral lower lid xanthelasmas and upper lid to small areas. Patient is apprehensive after undergoing surgery.   Caprini score=6, patient will require mechanical prophylaxis during surgery and possible pharmacological prophylaxis on day of surgery.  Plan: Patient scheduled for excision of four lid xanthelasmas with Dr.Dillingham on 03/11/19.  Discussed importance of increased protein and decreased carb and sugar intake for wound healing. Risks, benefits, and alternatives of procedure discussed and expected recovery.Will request Dr. Marla Roe speak with patient about surgical technique prior to surgery. Patient was agreeable to this plan. No post-op medications ordered today.  COVID-19 pre-procedure test scheduled for 03/08/19. Post-op visit scheduled for 03/19/19.   Electronically signed by: Alfredo Batty, NP 03/01/2019 10:07 AM

## 2019-03-01 NOTE — H&P (View-Only) (Signed)
History of Present Illness: Sarah Barrett is a 78 y.o.  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 She presents for preoperative evaluation for upcoming procedure, excision of four lid xanthelasmas, scheduled for 03/11/19 with Dr. Marla Roe. Patient states the lesions have been present for several years. Patient states she is very worried about the area "looking worse" after surgery. Patient underwent an excision of two lipomas 3 weeks ago by another provider. Patient states she is not happy with the result and is now very hesitant to undergo any other surgery. Patient requests to speak directly to Dr. Marla Roe about surgical technique and risks. Patient would also like to discuss removing a 4 mm x 5 mm mole on her back.   Past medical history is significant for breast cancer of both breasts 20 years ago, HTN, and HLD. Previous surgical history includes; excision of lipomas on bilateral arms on 02/04/19. Patient states she had hallucinations after receiving morphine after recent surgery. Patient denies any other adverse reactions to anesthesia. Patient denies any personal or family history of blood clots.     Past Medical History: Allergies: Allergies  Allergen Reactions  . Morphine And Related Other (See Comments)    Hallucinations, doesn't like how it made her feel    Current Medications:  Current Outpatient Medications:  .  Calcium Carbonate-Vitamin D (CALCIUM + D PO), Take 1 capsule by mouth daily., Disp: , Rfl:  .  hydroxypropyl methylcellulose / hypromellose (ISOPTO TEARS / GONIOVISC) 2.5 % ophthalmic solution, Place 1 drop into both eyes as needed for dry eyes., Disp: , Rfl:  .  Multiple Vitamins-Minerals (MULTIVITAMIN PO), Take 1 capsule by mouth daily., Disp: , Rfl:  .  Omega-3 Fatty Acids (FISH OIL CONCENTRATE PO), Take 1 capsule by mouth daily., Disp: , Rfl:   Past Medical Problems: Past Medical History:  Diagnosis Date  . Arm mass, right   . History of  bilateral breast cancer released from oncologist-- dr Truddie Coco 2010, (02-01-2019 per pt no recurrence)   dx right side 1999 s/p  breast lumpectomy and IMRT;  dx left side 2000 s/p breast lumpectomy and completed IMRT 2000  . Hypertension    02-01-2019-- per pt was given medication by pcp, took for 3 months, last taken 08 /2020, pt states blood pressure better montior's at home but has not checked it for few weeks  . OA (osteoarthritis)   . Shoulder mass    right  . Wears hearing aid in both ears   . Wears partial dentures    upper    Past Surgical History: Past Surgical History:  Procedure Laterality Date  . BREAST LUMPECTOMY Bilateral right 1999;  left 04/ 2000  . CATARACT EXTRACTION W/ INTRAOCULAR LENS  IMPLANT, BILATERAL  yrs ago  . KNEE ARTHROSCOPY W/ MENISCAL REPAIR Right 2018  . LUMBAR FUSION  07-07-2001   dr Carloyn Manner @[facility]    L5 --S1   . MASS EXCISION N/A 02/04/2019   Procedure: EXCISION OF RIGHT SHOULDER SKIN MASS AND LEFT ARM SKIN MASS;  Surgeon: Kieth Brightly, Arta Bruce, MD;  Location: [facility];  Service: General;  Laterality: N/A;  . REMOVAL OF URINARY SLING  12-20-2003   dr Jeffie Pollock @[facility]   . TOTAL ABDOMINAL HYSTERECTOMY W/ BILATERAL SALPINGOOPHORECTOMY  1995  . TRANSOBTURATOR SLING  08/2003  . WISDOM TOOTH EXTRACTION      The patient has had anesthesia or sedation in the past.   The patient HAS had problems with anesthesia. Reaction to  morphine The patient does NOT have a family history of anesthesia problems.    Social History: Social History   Socioeconomic History  . Marital status: Married    Spouse name: Not on file  . Number of children: Not on file  . Years of education: Not on file  . Highest education level: Not on file  Occupational History  . Not on file  Social Needs  . Financial resource strain: Not on file  . Food insecurity    Worry: Not on file    Inability: Not on file  . Transportation needs    Medical: Not on file    Non-medical: Not on  file  Tobacco Use  . Smoking status: Never Smoker  . Smokeless tobacco: Never Used  Substance and Sexual Activity  . Alcohol use: No  . Drug use: Never  . Sexual activity: Not on file  Lifestyle  . Physical activity    Days per week: Not on file    Minutes per session: Not on file  . Stress: Not on file  Relationships  . Social Herbalist on phone: Not on file    Gets together: Not on file    Attends religious service: Not on file    Active member of club or organization: Not on file    Attends meetings of clubs or organizations: Not on file    Relationship status: Not on file  . Intimate partner violence    Fear of current or ex partner: Not on file    Emotionally abused: Not on file    Physically abused: Not on file    Forced sexual activity: Not on file  Other Topics Concern  . Not on file  Social History Narrative  . Not on file    Family History: Family History  Problem Relation Age of Onset  . Breast cancer Mother   . Breast cancer Maternal Grandmother   . Cancer Sister   . Cervical cancer Maternal Aunt   . Colon cancer Neg Hx   . Rectal cancer Neg Hx   . Stomach cancer Neg Hx     Review of Systems: General ROS: negative Dermatological ROS: mole on back Cardiovascular ROS: no chest pain or dyspnea on exertion ENT ROS: wears hearing aids and upper dentures Gastrointestinal ROS: no abdominal pain, change in bowel habits, or black or bloody stools  Physical Exam: Vital Signs BP (!) 166/71 (BP Location: Left Arm, Patient Position: Sitting, Cuff Size: Normal)   Pulse 73   Temp (!) 96.6 F (35.9 C) (Temporal)   Ht 4\' 11"  (1.499 m)   Wt 138 lb 3.2 oz (62.7 kg)   SpO2 98%   BMI 27.91 kg/m  General: awake, alert, no acute distress HEENT: upper dentures, tan colored slightly raised lesion immediately inferior to bilateral lower eyelids and small lesions on bilateral upper lids Neck: supple, full ROM Chest: symmetrical rise and fall Cardiac:  regular rate and rhythm, +2 radial pulses bilaterally, +2 pedal pulses bilaterally Abdomen: soft, non-distended, non-tender Musculoskeletal: MAE x4 Neuro: A&Ox3 Skin: 4 mm x 5 mm skin colored raised mole on mid back along spine  Assessment: 78 y.o.female  with a history of bilateral lower lid xanthelasmas and upper lid to small areas. Patient is apprehensive after undergoing surgery.   Caprini score=6, patient will require mechanical prophylaxis during surgery and possible pharmacological prophylaxis on day of surgery.  Plan: Patient scheduled for excision of four lid xanthelasmas with Dr.Dillingham on 03/11/19.  Discussed importance of increased protein and decreased carb and sugar intake for wound healing. Risks, benefits, and alternatives of procedure discussed and expected recovery.Will request Dr. Marla Roe speak with patient about surgical technique prior to surgery. Patient was agreeable to this plan. No post-op medications ordered today.  COVID-19 pre-procedure test scheduled for 03/08/19. Post-op visit scheduled for 03/19/19.   Electronically signed by: Alfredo Batty, NP 03/01/2019 10:07 AM

## 2019-03-02 ENCOUNTER — Encounter: Payer: Medicare HMO | Admitting: Nurse Practitioner

## 2019-03-04 ENCOUNTER — Other Ambulatory Visit: Payer: Self-pay

## 2019-03-04 ENCOUNTER — Encounter (HOSPITAL_BASED_OUTPATIENT_CLINIC_OR_DEPARTMENT_OTHER): Payer: Self-pay | Admitting: *Deleted

## 2019-03-08 ENCOUNTER — Telehealth: Payer: Self-pay | Admitting: Plastic Surgery

## 2019-03-08 ENCOUNTER — Other Ambulatory Visit (HOSPITAL_COMMUNITY)
Admission: RE | Admit: 2019-03-08 | Discharge: 2019-03-08 | Disposition: A | Discharge status: Home / Self Care | Payer: Medicare HMO | Source: Ambulatory Visit | Attending: Plastic Surgery | Admitting: Plastic Surgery

## 2019-03-08 DIAGNOSIS — Z20828 Contact with and (suspected) exposure to other viral communicable diseases: Secondary | ICD-10-CM | POA: Diagnosis not present

## 2019-03-08 DIAGNOSIS — Z01812 Encounter for preprocedural laboratory examination: Secondary | ICD-10-CM | POA: Diagnosis not present

## 2019-03-08 NOTE — Telephone Encounter (Signed)
Pt called to speak with Dr. Marla Roe about surgery on Thursday.

## 2019-03-09 LAB — NOVEL CORONAVIRUS, NAA (HOSP ORDER, SEND-OUT TO REF LAB; TAT 18-24 HRS): SARS-CoV-2, NAA: NOT DETECTED

## 2019-03-11 ENCOUNTER — Ambulatory Visit (HOSPITAL_BASED_OUTPATIENT_CLINIC_OR_DEPARTMENT_OTHER): Payer: Medicare HMO | Admitting: Anesthesiology

## 2019-03-11 ENCOUNTER — Other Ambulatory Visit: Payer: Self-pay

## 2019-03-11 ENCOUNTER — Encounter (HOSPITAL_BASED_OUTPATIENT_CLINIC_OR_DEPARTMENT_OTHER): Payer: Self-pay | Admitting: Anesthesiology

## 2019-03-11 ENCOUNTER — Ambulatory Visit (HOSPITAL_BASED_OUTPATIENT_CLINIC_OR_DEPARTMENT_OTHER)
Admission: RE | Admit: 2019-03-11 | Discharge: 2019-03-11 | Disposition: A | Discharge status: Home / Self Care | Payer: Medicare HMO | Attending: Plastic Surgery | Admitting: Plastic Surgery

## 2019-03-11 ENCOUNTER — Encounter (HOSPITAL_BASED_OUTPATIENT_CLINIC_OR_DEPARTMENT_OTHER)
Admission: RE | Disposition: A | Discharge status: Home / Self Care | Payer: Self-pay | Source: Home / Self Care | Attending: Plastic Surgery

## 2019-03-11 DIAGNOSIS — H0260 Xanthelasma of unspecified eye, unspecified eyelid: Secondary | ICD-10-CM | POA: Diagnosis not present

## 2019-03-11 DIAGNOSIS — Z9841 Cataract extraction status, right eye: Secondary | ICD-10-CM | POA: Diagnosis not present

## 2019-03-11 DIAGNOSIS — H0262 Xanthelasma of right lower eyelid: Secondary | ICD-10-CM | POA: Diagnosis not present

## 2019-03-11 DIAGNOSIS — Z885 Allergy status to narcotic agent status: Secondary | ICD-10-CM | POA: Insufficient documentation

## 2019-03-11 DIAGNOSIS — E785 Hyperlipidemia, unspecified: Secondary | ICD-10-CM | POA: Diagnosis not present

## 2019-03-11 DIAGNOSIS — Z9842 Cataract extraction status, left eye: Secondary | ICD-10-CM | POA: Diagnosis not present

## 2019-03-11 DIAGNOSIS — Z809 Family history of malignant neoplasm, unspecified: Secondary | ICD-10-CM | POA: Insufficient documentation

## 2019-03-11 DIAGNOSIS — M1990 Unspecified osteoarthritis, unspecified site: Secondary | ICD-10-CM | POA: Diagnosis not present

## 2019-03-11 DIAGNOSIS — I10 Essential (primary) hypertension: Secondary | ICD-10-CM | POA: Insufficient documentation

## 2019-03-11 DIAGNOSIS — H0265 Xanthelasma of left lower eyelid: Secondary | ICD-10-CM | POA: Diagnosis not present

## 2019-03-11 DIAGNOSIS — Z961 Presence of intraocular lens: Secondary | ICD-10-CM | POA: Insufficient documentation

## 2019-03-11 DIAGNOSIS — H0266 Xanthelasma of left eye, unspecified eyelid: Secondary | ICD-10-CM | POA: Diagnosis not present

## 2019-03-11 DIAGNOSIS — Z981 Arthrodesis status: Secondary | ICD-10-CM | POA: Insufficient documentation

## 2019-03-11 DIAGNOSIS — H0263 Xanthelasma of right eye, unspecified eyelid: Secondary | ICD-10-CM | POA: Diagnosis not present

## 2019-03-11 DIAGNOSIS — D225 Melanocytic nevi of trunk: Secondary | ICD-10-CM | POA: Diagnosis not present

## 2019-03-11 DIAGNOSIS — Z79899 Other long term (current) drug therapy: Secondary | ICD-10-CM | POA: Insufficient documentation

## 2019-03-11 DIAGNOSIS — Z803 Family history of malignant neoplasm of breast: Secondary | ICD-10-CM | POA: Insufficient documentation

## 2019-03-11 DIAGNOSIS — Z853 Personal history of malignant neoplasm of breast: Secondary | ICD-10-CM | POA: Diagnosis not present

## 2019-03-11 DIAGNOSIS — Z90722 Acquired absence of ovaries, bilateral: Secondary | ICD-10-CM | POA: Insufficient documentation

## 2019-03-11 DIAGNOSIS — Z8049 Family history of malignant neoplasm of other genital organs: Secondary | ICD-10-CM | POA: Insufficient documentation

## 2019-03-11 HISTORY — PX: LESION EXCISION WITH COMPLEX REPAIR: SHX6700

## 2019-03-11 SURGERY — LESION EXCISION WITH COMPLEX REPAIR
Anesthesia: General | Site: Eye | Laterality: Bilateral

## 2019-03-11 MED ORDER — CEFAZOLIN SODIUM-DEXTROSE 2-4 GM/100ML-% IV SOLN
INTRAVENOUS | Status: AC
  Filled 2019-03-11: qty 100

## 2019-03-11 MED ORDER — FENTANYL CITRATE (PF) 100 MCG/2ML IJ SOLN: 25.0000 ug | INTRAMUSCULAR | Status: DC | PRN

## 2019-03-11 MED ORDER — PROPOFOL 10 MG/ML IV BOLUS
INTRAVENOUS | Status: DC | PRN
  Administered 2019-03-11: 150 mg via INTRAVENOUS

## 2019-03-11 MED ORDER — FENTANYL CITRATE (PF) 100 MCG/2ML IJ SOLN
50.0000 ug | INTRAMUSCULAR | Status: DC | PRN
  Administered 2019-03-11: 50 ug via INTRAVENOUS

## 2019-03-11 MED ORDER — FENTANYL CITRATE (PF) 100 MCG/2ML IJ SOLN
INTRAMUSCULAR | Status: AC
  Filled 2019-03-11: qty 2

## 2019-03-11 MED ORDER — DEXAMETHASONE SODIUM PHOSPHATE 4 MG/ML IJ SOLN
INTRAMUSCULAR | Status: DC | PRN
  Administered 2019-03-11: 4 mg via INTRAVENOUS

## 2019-03-11 MED ORDER — ONDANSETRON HCL 4 MG/2ML IJ SOLN
INTRAMUSCULAR | Status: DC | PRN
  Administered 2019-03-11: 4 mg via INTRAVENOUS

## 2019-03-11 MED ORDER — OXYCODONE HCL 5 MG PO TABS: 5.0000 mg | ORAL_TABLET | ORAL | Status: DC | PRN

## 2019-03-11 MED ORDER — MEPERIDINE HCL 25 MG/ML IJ SOLN: 6.2500 mg | INTRAMUSCULAR | Status: DC | PRN

## 2019-03-11 MED ORDER — LIDOCAINE-EPINEPHRINE 1 %-1:100000 IJ SOLN
INTRAMUSCULAR | Status: DC | PRN
  Administered 2019-03-11: 2 mL

## 2019-03-11 MED ORDER — SODIUM CHLORIDE 0.9% FLUSH: 3.0000 mL | Freq: Two times a day (BID) | INTRAVENOUS | Status: DC

## 2019-03-11 MED ORDER — CEFAZOLIN SODIUM-DEXTROSE 2-4 GM/100ML-% IV SOLN: 2.0000 g | INTRAVENOUS | Status: DC

## 2019-03-11 MED ORDER — CHLORHEXIDINE GLUCONATE CLOTH 2 % EX PADS: 6.0000 | MEDICATED_PAD | Freq: Once | CUTANEOUS | Status: DC

## 2019-03-11 MED ORDER — MIDAZOLAM HCL 2 MG/2ML IJ SOLN: 1.0000 mg | INTRAMUSCULAR | Status: DC | PRN

## 2019-03-11 MED ORDER — ACETAMINOPHEN 325 MG PO TABS: 650.0000 mg | ORAL_TABLET | ORAL | Status: DC | PRN

## 2019-03-11 MED ORDER — ACETAMINOPHEN 650 MG RE SUPP: 650.0000 mg | RECTAL | Status: DC | PRN

## 2019-03-11 MED ORDER — LACTATED RINGERS IV SOLN
INTRAVENOUS | Status: DC
  Administered 2019-03-11: 09:00:00 via INTRAVENOUS

## 2019-03-11 MED ORDER — METOCLOPRAMIDE HCL 5 MG/ML IJ SOLN: 10.0000 mg | Freq: Once | INTRAMUSCULAR | Status: DC | PRN

## 2019-03-11 MED ORDER — SODIUM CHLORIDE 0.9% FLUSH: 3.0000 mL | INTRAVENOUS | Status: DC | PRN

## 2019-03-11 MED ORDER — SODIUM CHLORIDE 0.9 % IV SOLN: 250.0000 mL | INTRAVENOUS | Status: DC | PRN

## 2019-03-11 MED ORDER — EPHEDRINE SULFATE 50 MG/ML IJ SOLN
INTRAMUSCULAR | Status: DC | PRN
  Administered 2019-03-11: 10 mg via INTRAVENOUS

## 2019-03-11 MED ORDER — TOBRAMYCIN 0.3 % OP OINT
TOPICAL_OINTMENT | OPHTHALMIC | Status: DC | PRN
  Administered 2019-03-11: 1 via OPHTHALMIC

## 2019-03-11 MED ORDER — PHENYLEPHRINE HCL (PRESSORS) 10 MG/ML IV SOLN
INTRAVENOUS | Status: DC | PRN
  Administered 2019-03-11: 80 ug via INTRAVENOUS

## 2019-03-11 MED ORDER — TOBRAMYCIN-DEXAMETHASONE 0.3-0.1 % OP OINT
TOPICAL_OINTMENT | OPHTHALMIC | Status: AC
  Filled 2019-03-11: qty 3.5

## 2019-03-11 MED ORDER — LIDOCAINE HCL (CARDIAC) PF 100 MG/5ML IV SOSY
PREFILLED_SYRINGE | INTRAVENOUS | Status: DC | PRN
  Administered 2019-03-11: 50 mg via INTRAVENOUS

## 2019-03-11 SURGICAL SUPPLY — 59 items
BLADE CLIPPER SURG (BLADE) IMPLANT
BLADE SURG 15 STRL LF DISP TIS (BLADE) ×1 IMPLANT
BLADE SURG 15 STRL SS (BLADE) ×2
BNDG CONFORM 2 STRL LF (GAUZE/BANDAGES/DRESSINGS) IMPLANT
BNDG ELASTIC 2X5.8 VLCR STR LF (GAUZE/BANDAGES/DRESSINGS) IMPLANT
CANISTER SUCT 1200ML W/VALVE (MISCELLANEOUS) ×3 IMPLANT
CHLORAPREP W/TINT 26 (MISCELLANEOUS) IMPLANT
CLOSURE WOUND 1/2 X4 (GAUZE/BANDAGES/DRESSINGS)
CORD BIPOLAR FORCEPS 12FT (ELECTRODE) IMPLANT
COVER BACK TABLE REUSABLE LG (DRAPES) ×3 IMPLANT
COVER MAYO STAND REUSABLE (DRAPES) ×3 IMPLANT
COVER WAND RF STERILE (DRAPES) IMPLANT
DECANTER SPIKE VIAL GLASS SM (MISCELLANEOUS) ×3 IMPLANT
DERMABOND ADVANCED (GAUZE/BANDAGES/DRESSINGS)
DERMABOND ADVANCED .7 DNX12 (GAUZE/BANDAGES/DRESSINGS) IMPLANT
DRAPE HALF SHEET 70X43 (DRAPES) IMPLANT
DRAPE LAPAROTOMY 100X72 PEDS (DRAPES) IMPLANT
DRAPE U-SHAPE 76X120 STRL (DRAPES) ×3 IMPLANT
DRSG TEGADERM 2-3/8X2-3/4 SM (GAUZE/BANDAGES/DRESSINGS) IMPLANT
ELECT NEEDLE BLADE 2-5/6 (NEEDLE) ×3 IMPLANT
ELECT REM PT RETURN 9FT ADLT (ELECTROSURGICAL) ×3
ELECT REM PT RETURN 9FT PED (ELECTROSURGICAL)
ELECTRODE REM PT RETRN 9FT PED (ELECTROSURGICAL) IMPLANT
ELECTRODE REM PT RTRN 9FT ADLT (ELECTROSURGICAL) ×1 IMPLANT
GAUZE SPONGE 4X4 12PLY STRL LF (GAUZE/BANDAGES/DRESSINGS) IMPLANT
GAUZE XEROFORM 1X8 LF (GAUZE/BANDAGES/DRESSINGS) IMPLANT
GLOVE BIO SURGEON STRL SZ 6.5 (GLOVE) ×4 IMPLANT
GLOVE BIO SURGEONS STRL SZ 6.5 (GLOVE) ×2
GLOVE ECLIPSE 7.0 STRL STRAW (GLOVE) ×3 IMPLANT
GOWN STRL REUS W/ TWL LRG LVL3 (GOWN DISPOSABLE) ×3 IMPLANT
GOWN STRL REUS W/TWL LRG LVL3 (GOWN DISPOSABLE) ×6
NEEDLE HYPO 30GX1 BEV (NEEDLE) ×3 IMPLANT
NEEDLE PRECISIONGLIDE 27X1.5 (NEEDLE) IMPLANT
NS IRRIG 1000ML POUR BTL (IV SOLUTION) IMPLANT
PACK BASIN DAY SURGERY FS (CUSTOM PROCEDURE TRAY) ×3 IMPLANT
PENCIL BUTTON HOLSTER BLD 10FT (ELECTRODE) ×3 IMPLANT
SPONGE GAUZE 2X2 8PLY STER LF (GAUZE/BANDAGES/DRESSINGS)
SPONGE GAUZE 2X2 8PLY STRL LF (GAUZE/BANDAGES/DRESSINGS) IMPLANT
STRIP CLOSURE SKIN 1/2X4 (GAUZE/BANDAGES/DRESSINGS) IMPLANT
SUCTION FRAZIER HANDLE 10FR (MISCELLANEOUS)
SUCTION TUBE FRAZIER 10FR DISP (MISCELLANEOUS) IMPLANT
SUT MNCRL 6-0 UNDY P1 1X18 (SUTURE) ×2 IMPLANT
SUT MNCRL AB 4-0 PS2 18 (SUTURE) IMPLANT
SUT MON AB 5-0 P3 18 (SUTURE) IMPLANT
SUT MON AB 5-0 PS2 18 (SUTURE) IMPLANT
SUT MONOCRYL 6-0 P1 1X18 (SUTURE) ×4
SUT PROLENE 5 0 P 3 (SUTURE) IMPLANT
SUT PROLENE 5 0 PS 2 (SUTURE) IMPLANT
SUT PROLENE 6 0 P 1 18 (SUTURE) IMPLANT
SUT VIC AB 5-0 P-3 18X BRD (SUTURE) IMPLANT
SUT VIC AB 5-0 P3 18 (SUTURE)
SUT VIC AB 5-0 PS2 18 (SUTURE) IMPLANT
SUT VICRYL 4-0 PS2 18IN ABS (SUTURE) IMPLANT
SYR BULB 3OZ (MISCELLANEOUS) IMPLANT
SYR CONTROL 10ML LL (SYRINGE) ×3 IMPLANT
TOWEL GREEN STERILE FF (TOWEL DISPOSABLE) ×3 IMPLANT
TRAY DSU PREP LF (CUSTOM PROCEDURE TRAY) ×3 IMPLANT
TUBE CONNECTING 20'X1/4 (TUBING) ×1
TUBE CONNECTING 20X1/4 (TUBING) ×2 IMPLANT

## 2019-03-11 NOTE — Op Note (Signed)
Operative Note  Date of operation: 03/11/2019  Patient: Sarah Barrett, MRN: [id number], 78 y.o. female.  Date of birth:  1941/04/29  Location: [place]  Preoperative Diagnosis: Xanthelasma lower eyelid skin  Postoperative Diagnosis: Same  Procedure: Excision of lower eyelid xanthelasma  Right - 5 x 25 mm Left - 4 x 20 mm   Surgeon: Theodoro Kos Dillingham, DO  Assistant: Roetta Sessions, PA  Condition: Stable  Complications: None   EBL: Minimal  Disposition: Recovery Room  Indications:   Improve quality of life.  The patient presented with concerns regarding growing skin lesions on the upper and lower lids.  Surgical risks and benefits were discussed in detail and are in the chart. We discussed that all of the lesions were not going to be removed in order to prevent distortion of the lower lids and exposure of the eye.   Procedure in detail: Patient was seen on the morning of the procedure after anesthesia evaluation. The patient was marked in holding. All questions were answered.  The patient was then taken to the operating room.  Anesthesia was administered and a time out was called with all information confirmed to be correct.  The patient was prepped and draped with a betadine prep. The markings were confirmed.  Local anesthetic with epinephrine was injected. The local was given several minutes to take effect.  Right Upper lid.  Local with epinepherine was injected in the lower lids for intraoperative hemostasis and post operative pain control.  A # 15 blade was used to excise a 5 x 25 mm area of effected skin.  The tissue scissors were used to excise the skin and leave the muscle and fat intact.  The 6-0 Monocryl was used to close the incision with a running subcuticular closure.  Left Upper lid:  Local with epinepherine was injected in the lower lids for intraoperative hemostasis and post operative pain control.  A # 15 blade was used to excise a 4 x 20  mm area of effected skin.  The tissue scissors were used to excise the skin and leave the muscle and fat intact.  The 6-0 Monocryl was used to close the incision with a running subcuticular closure.  The patient was taken to PACU with no complications. Family was notified at the end of the case.  There was no pulling of the lower lids.  The advanced practice practitioner (APP) assisted throughout the case.  The APP was essential in retraction and counter traction when needed to make the case progress smoothly.  This retraction and assistance made it possible to see the tissue plans for the procedure.  The assistance was needed for blood control, tissue re-approximation and assisted with closure of the incision site.

## 2019-03-11 NOTE — Anesthesia Preprocedure Evaluation (Signed)
Anesthesia Evaluation  Patient identified by MRN, date of birth, ID band Patient awake    Reviewed: Allergy & Precautions, NPO status , Patient's Chart, lab work & pertinent test results  Airway Mallampati: II  TM Distance: >3 FB Neck ROM: Full    Dental no notable dental hx.    Pulmonary neg pulmonary ROS,    Pulmonary exam normal breath sounds clear to auscultation       Cardiovascular negative cardio ROS Normal cardiovascular exam Rhythm:Regular Rate:Normal     Neuro/Psych negative neurological ROS  negative psych ROS   GI/Hepatic negative GI ROS, Neg liver ROS,   Endo/Other  negative endocrine ROS  Renal/GU negative Renal ROS  negative genitourinary   Musculoskeletal negative musculoskeletal ROS (+)   Abdominal   Peds negative pediatric ROS (+)  Hematology negative hematology ROS (+)   Anesthesia Other Findings   Reproductive/Obstetrics negative OB ROS                             Anesthesia Physical Anesthesia Plan  ASA: II  Anesthesia Plan: General   Post-op Pain Management:    Induction: Intravenous  PONV Risk Score and Plan: 3 and Ondansetron and Treatment may vary due to age or medical condition  Airway Management Planned: LMA  Additional Equipment:   Intra-op Plan:   Post-operative Plan:   Informed Consent: I have reviewed the patients History and Physical, chart, labs and discussed the procedure including the risks, benefits and alternatives for the proposed anesthesia with the patient or authorized representative who has indicated his/her understanding and acceptance.     Dental advisory given  Plan Discussed with: CRNA  Anesthesia Plan Comments:         Anesthesia Quick Evaluation

## 2019-03-11 NOTE — Discharge Instructions (Signed)
INSTRUCTIONS FOR AFTER SURGERY   You are having surgery.  You will likely have some questions about what to expect following your operation.  The following information will help you and your family understand what to expect when you are discharged from the hospital.  Following these guidelines will help ensure a smooth recovery and reduce risks of complications.   Postoperative instructions include information on: diet, wound care, medications and physical activity.  AFTER SURGERY Expect to go home after the procedure.  In some cases, you may need to spend one night in the hospital for observation.  DIET This surgery does not require a specific diet.  However, I have to mention that the healthier you eat the better your body can start healing. It is important to increasing your protein intake.  This means limiting the foods with sugar and carbohydrates.  Focus on vegetables and some meat.  If you have any liposuction during your procedure be sure to drink water.  If your urine is bright yellow, then it is concentrated, and you need to drink more water.  As a general rule after surgery, you should have 8 ounces of water every hour while awake.  If you find you are persistently nauseated or unable to take in liquids let us know.  NO TOBACCO USE or EXPOSURE.  This will slow your healing process and increase the risk of a wound.  WOUND CARE You can shower the day after surgery.  Use fragrance free soap.  Dial, [place] and [place] are usually mild on the skin.  If you have steri-strips / tape directly attached to your skin leave them in place. It is OK to get these wet.  No baths, pools or hot tubs for two weeks. We close your incision to leave the smallest and best-looking scar. No ointment or creams on your incisions until given the go ahead.  Especially not Neosporin (Too many skin reactions with this one).  A few weeks after surgery you can use Mederma and start massaging the scar. Ice to face as able for  next 24 hours with 15 min on and then 15 min off.  Tobradex to incisions twice a day.  ACTIVITY No heavy lifting until cleared by the doctor.  It is OK to walk and climb stairs. In fact, moving your legs is very important to decrease your risk of a blood clot.  It will also help keep you from getting deconditioned.  Every 1 to 2 hours get up and walk for 5 minutes. This will help with a quicker recovery back to normal.  Let pain be your guide so you don't do too much.  NO, you cannot do the spring cleaning and don't plan on taking care of anyone else.  This is your time for TLC.  You will be more comfortable if you sleep and rest with your head elevated either with a few pillows under you or in a recliner.  No stomach sleeping for a few months.  WORK Everyone returns to work at different times. As a rough guide, most people take at least 1 - 2 weeks off prior to returning to work. If you need documentation for your job, bring the forms to your postoperative follow up visit.  DRIVING Arrange for someone to bring you home from the hospital.  You may be able to drive a few days after surgery but not while taking any narcotics or valium.  BOWEL MOVEMENTS Constipation can occur after anesthesia and while taking pain  medication.  It is important to stay ahead for your comfort.  We recommend taking Milk of Magnesia (2 tablespoons; twice a day) while taking the pain pills.  SEROMA This is fluid your body tried to put in the surgical site.  This is normal but if it creates tight skinny skin let us know.  It usually decreases in a few weeks.  WHEN TO CALL Call your surgeon's office if any of the following occur:  Fever 101 degrees F or greater  Excessive bleeding or fluid from the incision site.  Pain that increases over time without aid from the medications  Redness, warmth, or pus draining from incision sites  Persistent nausea or inability to take in liquids  Severe misshapen area that  underwent the operation.   Post Anesthesia Home Care Instructions  Activity: Get plenty of rest for the remainder of the day. A responsible individual must stay with you for 24 hours following the procedure.  For the next 24 hours, DO NOT: -Drive a car -Paediatric nurse -Drink alcoholic beverages -Take any medication unless instructed by your physician -Make any legal decisions or sign important papers.  Meals: Start with liquid foods such as gelatin or soup. Progress to regular foods as tolerated. Avoid greasy, spicy, heavy foods. If nausea and/or vomiting occur, drink only clear liquids until the nausea and/or vomiting subsides. Call your physician if vomiting continues.  Special Instructions/Symptoms: Your throat may feel dry or sore from the anesthesia or the breathing tube placed in your throat during surgery. If this causes discomfort, gargle with warm salt water. The discomfort should disappear within 24 hours.  If you had a scopolamine patch placed behind your ear for the management of post- operative nausea and/or vomiting:  1. The medication in the patch is effective for 72 hours, after which it should be removed.  Wrap patch in a tissue and discard in the trash. Wash hands thoroughly with soap and water. 2. You may remove the patch earlier than 72 hours if you experience unpleasant side effects which may include dry mouth, dizziness or visual disturbances. 3. Avoid touching the patch. Wash your hands with soap and water after contact with the patch.

## 2019-03-11 NOTE — Anesthesia Procedure Notes (Signed)
Procedure Name: LMA Insertion Date/Time: 03/11/2019 9:41 AM Performed by: Willa Frater, CRNA Pre-anesthesia Checklist: Patient identified, Emergency Drugs available, Suction available and Patient being monitored Patient Re-evaluated:Patient Re-evaluated prior to induction Oxygen Delivery Method: Circle system utilized Preoxygenation: Pre-oxygenation with 100% oxygen Induction Type: IV induction Ventilation: Mask ventilation without difficulty LMA: LMA inserted LMA Size: 4.0 Number of attempts: 1 Airway Equipment and Method: Bite block Placement Confirmation: positive ETCO2 Tube secured with: Tape Dental Injury: Teeth and Oropharynx as per pre-operative assessment

## 2019-03-11 NOTE — Interval H&P Note (Signed)
History and Physical Interval Note:  03/11/2019 8:58 AM  Sarah Barrett  has presented today for surgery, with the diagnosis of xanthelasma of bilateral eyelids.  The various methods of treatment have been discussed with the patient and family. After consideration of risks, benefits and other options for treatment, the patient has consented to  Procedure(s) with comments: Bilateral excision of four lid xanthelasma (Bilateral) - 1 hour, please as a surgical intervention.  The patient's history has been reviewed, patient examined, no change in status, stable for surgery.  I have reviewed the patient's chart and labs.  Questions were answered to the patient's satisfaction.     Loel Lofty Tadeusz Stahl

## 2019-03-11 NOTE — Transfer of Care (Signed)
Immediate Anesthesia Transfer of Care Note  Patient: Sarah Barrett  Procedure(s) Performed: Bilateral excision of eye lid xanthelasma (Bilateral Eye)  Patient Location: PACU  Anesthesia Type:General  Level of Consciousness: awake, alert  and drowsy  Airway & Oxygen Therapy: Patient Spontanous Breathing and Patient connected to face mask oxygen  Post-op Assessment: Report given to RN and Post -op Vital signs reviewed and stable  Post vital signs: Reviewed and stable  Last Vitals:  Vitals Value Taken Time  BP 119/56 03/11/19 1017  Temp    Pulse 99 03/11/19 1018  Resp 17 03/11/19 1018  SpO2 96 % 03/11/19 1018  Vitals shown include unvalidated device data.  Last Pain:  Vitals:   03/11/19 0912  TempSrc: Oral  PainSc: 0-No pain      Patients Stated Pain Goal: 1 ([id number] [phone])  Complications: No apparent anesthesia complications

## 2019-03-11 NOTE — Anesthesia Postprocedure Evaluation (Signed)
Anesthesia Post Note  Patient: Sarah Barrett  Procedure(s) Performed: Bilateral excision of eye lid xanthelasma (Bilateral Eye)     Patient location during evaluation: PACU Anesthesia Type: General Level of consciousness: awake and alert Pain management: pain level controlled Vital Signs Assessment: post-procedure vital signs reviewed and stable Respiratory status: spontaneous breathing, nonlabored ventilation, respiratory function stable and patient connected to nasal cannula oxygen Cardiovascular status: blood pressure returned to baseline and stable Postop Assessment: no apparent nausea or vomiting Anesthetic complications: no    Last Vitals:  Vitals:   03/11/19 1045 03/11/19 1120  BP: 134/61 127/83  Pulse: 88 78  Resp: 14 16  Temp:  36.6 C  SpO2: 97% 100%    Last Pain:  Vitals:   03/11/19 1120  TempSrc:   PainSc: 0-No pain                 Montez Hageman

## 2019-03-12 ENCOUNTER — Encounter (HOSPITAL_BASED_OUTPATIENT_CLINIC_OR_DEPARTMENT_OTHER): Payer: Self-pay | Admitting: Plastic Surgery

## 2019-03-18 ENCOUNTER — Telehealth: Payer: Self-pay

## 2019-03-18 NOTE — Progress Notes (Signed)
   Subjective:     Patient ID: Sarah Barrett, female    DOB: 09/13/1940, 78 y.o.   MRN: [id number]  Chief Complaint  Patient presents with  . Post-op Follow-up    (B) excision of four lid    HPI: The patient is a 78 y.o. female here for follow-up after bilateral lower eyelid excision of xanthelasma on 03/11/2019 with Dr. Marla Roe.  Sarah Barrett is doing well, she is very pleased with her progress. She has no complaints today in regards to her bilateral lower eyelid xanthelasma. Incision b/l are c/d/i, no surrounding erythema, swelling, drainage noted. Mild ecchymosis noted of peri-wound - beginning to yellow.  No fever, chills, n/v.   She reports she had right and left shoulder/arm lipomas removed by Dr. Kieth Brightly ~ 6 weeks ago (02/04/19) and is interested in a revision of the right shoulder incision due to tightness of incision and appearance.  She is interested in moving forward with staged procedure for additional removal of xanthelasma.  Review of Systems  Constitutional: Positive for activity change. Negative for chills, fatigue and fever.  Eyes: Negative for pain, discharge, redness and itching.  Respiratory: Negative for shortness of breath.   Cardiovascular: Negative for chest pain and leg swelling.  Gastrointestinal: Negative for nausea and vomiting.  Skin: Negative for rash and wound.  Neurological: Negative for dizziness and weakness.     Objective:   Vital Signs BP 136/81 (BP Location: Left Arm, Patient Position: Sitting, Cuff Size: Normal)   Pulse 74   Temp (!) 97.5 F (36.4 C) (Temporal)   Ht 4\' 11"  (1.499 m)   Wt 138 lb (62.6 kg)   SpO2 97%   BMI 27.87 kg/m  Vital Signs and Nursing Note Reviewed  Physical Exam  Constitutional: She is oriented to person, place, and time and well-developed, well-nourished, and in no distress.  HENT:  Head: Normocephalic and atraumatic.    B/l lower eyelid incisions c/d/i, no erythema, swelling, dehiscence noted. No drainage  noted.   Cardiovascular: Normal rate.  Pulmonary/Chest: Effort normal.  Musculoskeletal: Normal range of motion.  Neurological: She is alert and oriented to person, place, and time. Gait normal.  Skin: Skin is warm and dry. No rash noted. She is not diaphoretic. No erythema. No pallor.     + ecchymosis (b/l lower eyelid)   Psychiatric: Mood and affect normal.      Assessment/Plan:     ICD-10-CM   1. Xanthelasma of eyelid, bilateral  H02.63    H02.66    Mrs. Dorschner is doing well, her incisions are healing well and she has no complaints.  Allow b/l lower eyelid incisions to heal for at least 3 months, then plan for removal of additional b/l lower eyelid xanthelasma.   Recommend mederma cream to b/l shoulder/arm incisions s/p lipoma excision. Recommend massaging incision where there is some scarring and fibrosis/hardness. Avoid mederma on open scab at superior aspect of R arm incision. In 3 months, will re-evaluate need for revision and patient's desire to move forward with revision of R arm scarring/fibrosis. Allow time to heal and monitor response to massage/mederma.  Call with questions or concerns, will plan for scheduling procedure to removal b/l xanthelasma and revising R arm incision.   Pictures were obtained of the patient and placed in the chart with the patient's or guardian's permission.  Carola Rhine Lagina Reader, PA-C 03/19/2019, 8:09 AM

## 2019-03-18 NOTE — Telephone Encounter (Signed)

## 2019-03-19 ENCOUNTER — Encounter: Payer: Self-pay | Admitting: Surgical

## 2019-03-19 ENCOUNTER — Ambulatory Visit (INDEPENDENT_AMBULATORY_CARE_PROVIDER_SITE_OTHER): Payer: Medicare HMO | Admitting: Surgical

## 2019-03-19 ENCOUNTER — Other Ambulatory Visit: Payer: Self-pay

## 2019-03-19 VITALS — BP 136/81 | HR 74 | Temp 97.5°F | Ht 59.0 in | Wt 138.0 lb

## 2019-03-19 DIAGNOSIS — H0266 Xanthelasma of left eye, unspecified eyelid: Secondary | ICD-10-CM

## 2019-03-19 DIAGNOSIS — H0263 Xanthelasma of right eye, unspecified eyelid: Secondary | ICD-10-CM

## 2019-03-30 LAB — SURGICAL PATHOLOGY

## 2019-04-22 DIAGNOSIS — K625 Hemorrhage of anus and rectum: Secondary | ICD-10-CM | POA: Diagnosis not present

## 2019-05-18 DIAGNOSIS — J069 Acute upper respiratory infection, unspecified: Secondary | ICD-10-CM | POA: Diagnosis not present

## 2019-05-19 DIAGNOSIS — J069 Acute upper respiratory infection, unspecified: Secondary | ICD-10-CM | POA: Diagnosis not present

## 2019-05-25 ENCOUNTER — Encounter: Payer: Self-pay | Admitting: Plastic Surgery

## 2019-05-25 ENCOUNTER — Other Ambulatory Visit: Payer: Self-pay

## 2019-05-25 ENCOUNTER — Ambulatory Visit (INDEPENDENT_AMBULATORY_CARE_PROVIDER_SITE_OTHER): Payer: Medicare HMO | Admitting: Plastic Surgery

## 2019-05-25 ENCOUNTER — Ambulatory Visit: Payer: Medicare HMO | Admitting: Plastic Surgery

## 2019-05-25 VITALS — BP 133/88 | HR 78 | Temp 97.9°F | Ht 59.5 in | Wt 131.0 lb

## 2019-05-25 DIAGNOSIS — L905 Scar conditions and fibrosis of skin: Secondary | ICD-10-CM

## 2019-05-25 DIAGNOSIS — H0263 Xanthelasma of right eye, unspecified eyelid: Secondary | ICD-10-CM

## 2019-05-25 DIAGNOSIS — H0266 Xanthelasma of left eye, unspecified eyelid: Secondary | ICD-10-CM

## 2019-05-25 MED ORDER — LIDOCAINE-PRILOCAINE 2.5-2.5 % EX CREA: 1.0000 "application " | TOPICAL_CREAM | CUTANEOUS | 0 refills | Status: DC | PRN

## 2019-05-25 NOTE — Progress Notes (Signed)
   Subjective:    Patient ID: Sarah Barrett, female    DOB: [birth year], 79 y.o.   MRN: [id number]  The patient is an 79 year old female here for evaluation of her abdomen.  She was originally scheduled for excision of xanthelasma of her face.  This was a staged procedure and this was to be her second procedure.  On exam she has some scleral show which is making it concerning for doing any further excision.  She also has some contracture and tightening of her abdominal soft tissue in her abdominal wall.  She has had multiple abdominal surgeries in the past.  She is finding it difficult to keep the area clean.  Sometimes it is painful with the contracture.  There is significant irregularity.     Review of Systems  Constitutional: Negative for activity change and appetite change.  HENT: Negative.   Eyes: Negative.   Respiratory: Negative.   Cardiovascular: Negative.   Gastrointestinal: Positive for abdominal pain. Negative for abdominal distention.  Genitourinary: Negative.   Musculoskeletal: Negative.   Psychiatric/Behavioral: Negative.        Objective:   Physical Exam Vitals and nursing note reviewed.  Constitutional:      Appearance: Normal appearance.  HENT:     Head: Atraumatic.  Cardiovascular:     Rate and Rhythm: Normal rate.     Pulses: Normal pulses.  Pulmonary:     Effort: Pulmonary effort is normal.  Abdominal:     General: Abdomen is flat. There is no distension.     Palpations: There is no mass.     Tenderness: There is no guarding.     Hernia: No hernia is present.  Skin:    General: Skin is warm.     Capillary Refill: Capillary refill takes less than 2 seconds.  Neurological:     General: No focal deficit present.     Mental Status: She is alert and oriented to person, place, and time.  Psychiatric:        Mood and Affect: Mood normal.        Thought Content: Thought content normal.         Assessment & Plan:     ICD-10-CM   1. Scar of abdominal wall   L90.5     The patient would benefit from release of the scar and contractures of her abdomen with excision of the area.  Pictures were obtained of the patient and placed in the chart with the patient's or guardian's permission. We will cancel the surgery for her lower lids and plan to do fractional as a trial to see if that will help improve it.

## 2019-05-25 NOTE — Progress Notes (Signed)
   Subjective:    Patient ID: Sarah Barrett, female    DOB: Apr 27, 1941, 79 y.o.   MRN: [id number]  The patient is an 79 year old female here for her preop visit for excision of xanthelasma of her bilateral lower lids.  She has a hint of a scleral show on the left and 1 mm scleral show on the right.  This is concerning.  I do not want this to be made worse by trying to cut the skin further.   Review of Systems  Constitutional: Negative.   HENT: Negative.   Respiratory: Negative.   Cardiovascular: Negative.   Musculoskeletal: Negative.        Objective:   Physical Exam Vitals and nursing note reviewed.  Constitutional:      Appearance: Normal appearance.  Cardiovascular:     Rate and Rhythm: Normal rate.  Pulmonary:     Effort: Pulmonary effort is normal.  Neurological:     General: No focal deficit present.     Mental Status: She is alert.  Psychiatric:        Mood and Affect: Mood normal.           Assessment & Plan:     ICD-10-CM   1. Xanthelasma of eyelid, bilateral  H02.63    H02.66     Pictures were obtained of the patient and placed in the chart with the patient's or guardian's permission. We had a discussion about my concern regarding her scleral show.  The patient agrees to be cautious.  We will try to do some laser to see if that helps it without doing more excision.  I am concerned that it will be an irreversible change.  We will cancel the surgery for now.

## 2019-05-25 NOTE — Progress Notes (Deleted)
Patient ID: Sarah Barrett, female    DOB: 12-20-1940, 79 y.o.   MRN: [id number]   Chief Complaint  Patient presents with  . Skin Problem    HPI  Review of Systems  Constitutional: Negative.   HENT: Negative.   Eyes: Negative.   Respiratory: Negative.   Cardiovascular: Negative.   Gastrointestinal: Negative.   Endocrine: Negative.   Genitourinary: Negative.   Musculoskeletal: Negative.   Hematological: Negative.   Psychiatric/Behavioral: Negative.     Past Medical History:  Diagnosis Date  . Arm mass, right   . History of bilateral breast cancer released from oncologist-- dr Truddie Coco 2010, (02-01-2019 per pt no recurrence)   dx right side 1999 s/p  breast lumpectomy and IMRT;  dx left side 2000 s/p breast lumpectomy and completed IMRT 2000  . OA (osteoarthritis)   . Shoulder mass    right  . Wears hearing aid in both ears   . Wears partial dentures    upper    Past Surgical History:  Procedure Laterality Date  . BREAST LUMPECTOMY Bilateral right 1999;  left 04/ 2000  . CATARACT EXTRACTION W/ INTRAOCULAR LENS  IMPLANT, BILATERAL  yrs ago  . KNEE ARTHROSCOPY W/ MENISCAL REPAIR Right 2018  . LESION EXCISION WITH COMPLEX REPAIR Bilateral 03/11/2019   Procedure: Bilateral excision of eye lid xanthelasma;  Surgeon: Wallace Going, DO;  Location: [facility];  Service: Plastics;  Laterality: Bilateral;  1 hour, please  . LUMBAR FUSION  07-07-2001   dr Carloyn Manner @[facility]    L5 --S1   . MASS EXCISION N/A 02/04/2019   Procedure: EXCISION OF RIGHT SHOULDER SKIN MASS AND LEFT ARM SKIN MASS;  Surgeon: Kieth Brightly, Arta Bruce, MD;  Location: [facility];  Service: General;  Laterality: N/A;  . REMOVAL OF URINARY SLING  12-20-2003   dr Jeffie Pollock @[facility]   . TOTAL ABDOMINAL HYSTERECTOMY W/ BILATERAL SALPINGOOPHORECTOMY  1995  . TRANSOBTURATOR SLING  08/2003  . WISDOM TOOTH EXTRACTION        Current Outpatient Medications:  .  Calcium Carbonate-Vitamin D (CALCIUM + D  PO), Take 1 capsule by mouth daily., Disp: , Rfl:  .  hydroxypropyl methylcellulose / hypromellose (ISOPTO TEARS / GONIOVISC) 2.5 % ophthalmic solution, Place 1 drop into both eyes as needed for dry eyes., Disp: , Rfl:  .  Multiple Vitamins-Minerals (MULTIVITAMIN PO), Take 1 capsule by mouth daily., Disp: , Rfl:  .  Omega-3 Fatty Acids (FISH OIL CONCENTRATE PO), Take 1 capsule by mouth daily., Disp: , Rfl:    Objective:   Vitals:   05/25/19 0839  BP: 133/88  Pulse: 78  Temp: 97.9 F (36.6 C)  SpO2: 98%    Physical Exam Vitals and nursing note reviewed.  Constitutional:      Appearance: Normal appearance.  Eyes:     Extraocular Movements: Extraocular movements intact.  Cardiovascular:     Rate and Rhythm: Normal rate.  Pulmonary:     Effort: Pulmonary effort is normal.  Abdominal:     General: There is no distension.  Skin:    General: Skin is warm.     Capillary Refill: Capillary refill takes less than 2 seconds.  Neurological:     General: No focal deficit present.     Mental Status: She is alert and oriented to person, place, and time.  Psychiatric:        Mood and Affect: Mood normal.        Behavior: Behavior normal.  Thought Content: Thought content normal.     Assessment & Plan:  Xanthelasma of eyelid, bilateral  ***  Loel Lofty Seana Underwood, DO

## 2019-06-07 ENCOUNTER — Other Ambulatory Visit (HOSPITAL_COMMUNITY): Payer: Medicare HMO

## 2019-06-10 ENCOUNTER — Ambulatory Visit (HOSPITAL_BASED_OUTPATIENT_CLINIC_OR_DEPARTMENT_OTHER): Admit: 2019-06-10 | Payer: Medicare HMO | Admitting: Plastic Surgery

## 2019-06-10 ENCOUNTER — Encounter (HOSPITAL_BASED_OUTPATIENT_CLINIC_OR_DEPARTMENT_OTHER): Payer: Self-pay

## 2019-06-10 SURGERY — EXCISION, NEVUS
Anesthesia: General | Site: Shoulder | Laterality: Right

## 2019-06-18 ENCOUNTER — Encounter: Payer: Medicare HMO | Admitting: Plastic Surgery

## 2019-06-22 DIAGNOSIS — E785 Hyperlipidemia, unspecified: Secondary | ICD-10-CM | POA: Diagnosis not present

## 2019-06-22 DIAGNOSIS — Z1389 Encounter for screening for other disorder: Secondary | ICD-10-CM | POA: Diagnosis not present

## 2019-06-22 DIAGNOSIS — Z0000 Encounter for general adult medical examination without abnormal findings: Secondary | ICD-10-CM | POA: Diagnosis not present

## 2019-06-22 DIAGNOSIS — Z853 Personal history of malignant neoplasm of breast: Secondary | ICD-10-CM | POA: Diagnosis not present

## 2019-06-22 DIAGNOSIS — I10 Essential (primary) hypertension: Secondary | ICD-10-CM | POA: Diagnosis not present

## 2019-06-22 DIAGNOSIS — I493 Ventricular premature depolarization: Secondary | ICD-10-CM | POA: Diagnosis not present

## 2019-06-24 DIAGNOSIS — K625 Hemorrhage of anus and rectum: Secondary | ICD-10-CM | POA: Diagnosis not present

## 2019-06-28 DIAGNOSIS — K625 Hemorrhage of anus and rectum: Secondary | ICD-10-CM | POA: Diagnosis not present

## 2019-06-29 ENCOUNTER — Other Ambulatory Visit: Payer: Self-pay

## 2019-06-29 ENCOUNTER — Encounter: Payer: Self-pay | Admitting: Plastic Surgery

## 2019-06-29 ENCOUNTER — Ambulatory Visit (INDEPENDENT_AMBULATORY_CARE_PROVIDER_SITE_OTHER): Payer: Medicare HMO | Admitting: Plastic Surgery

## 2019-06-29 VITALS — BP 167/82 | HR 74 | Temp 97.8°F | Ht 59.0 in | Wt 138.6 lb

## 2019-06-29 DIAGNOSIS — H0263 Xanthelasma of right eye, unspecified eyelid: Secondary | ICD-10-CM

## 2019-06-29 DIAGNOSIS — L905 Scar conditions and fibrosis of skin: Secondary | ICD-10-CM

## 2019-06-29 DIAGNOSIS — H0266 Xanthelasma of left eye, unspecified eyelid: Secondary | ICD-10-CM

## 2019-06-29 MED ORDER — CEPHALEXIN 500 MG PO CAPS: 500.0000 mg | ORAL_CAPSULE | Freq: Four times a day (QID) | ORAL | 0 refills | Status: AC

## 2019-06-29 NOTE — Progress Notes (Signed)
Preoperative Dx: Xanthelasma of bilateral lower eyelids  Postoperative Dx:  same  Procedure: laser to lower eyelids   Anesthesia: none  Description of Procedure:  Risks and complications were explained to the patient. Consent was confirmed and signed. Time out was called and all information was confirmed to be correct. The area  area was prepped with alcohol and wiped dry. The Frax laser was set at 35. The lower eyelids were lasered. The patient tolerated the procedure well and there were no complications. The patient is to follow up in 4 - 6 weeks.

## 2019-06-29 NOTE — H&P (View-Only) (Signed)
ICD-10-CM   1. Scar of abdominal wall  L90.5       Patient ID: Sarah Barrett, female    DOB: 12/13/1940, 79 y.o.   MRN: [id number]   History of Present Illness: Sarah Barrett is a 79 y.o.  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  She presents for preoperative evaluation for upcoming procedure, excision and release of abdominal wall contracture, scheduled for 07/21/2019 with Dr. Marla Roe.  Patient reports she has had multiple abdominal surgeries in the past leaving her with contracture and tightening of her abdominal soft tissue in her abdominal wall.  Sometimes the contracture can be painful.  She is finding it difficult to keep the area clean.  PMH significant for bilateral breast cancer approximately 20 years ago, HTN, and HLD.  Patient denies adverse reactions to anesthesia but reports hallucinations and difficulty after being given morphine after recent surgery.  Patient denies personal or family history of blood clots.  Past Medical History: Allergies: Allergies  Allergen Reactions  . Morphine And Related Other (See Comments)    Hallucinations, doesn't like how it made her feel    Current Medications:  Current Outpatient Medications:  .  Calcium Carbonate-Vitamin D (CALCIUM + D PO), Take 1 capsule by mouth daily., Disp: , Rfl:  .  hydroxypropyl methylcellulose / hypromellose (ISOPTO TEARS / GONIOVISC) 2.5 % ophthalmic solution, Place 1 drop into both eyes as needed for dry eyes., Disp: , Rfl:  .  lidocaine-prilocaine (EMLA) cream, Apply 1 application topically as needed. Applied to face 45 minutes prior to procedure, Disp: 30 g, Rfl: 0 .  Multiple Vitamins-Minerals (MULTIVITAMIN PO), Take 1 capsule by mouth daily., Disp: , Rfl:  .  Omega-3 Fatty Acids (FISH OIL CONCENTRATE PO), Take 1 capsule by mouth daily., Disp: , Rfl:   Past Medical Problems: Past Medical History:  Diagnosis Date  . Arm mass, right   . History of  bilateral breast cancer released from oncologist-- dr Truddie Coco 2010, (02-01-2019 per pt no recurrence)   dx right side 1999 s/p  breast lumpectomy and IMRT;  dx left side 2000 s/p breast lumpectomy and completed IMRT 2000  . OA (osteoarthritis)   . Shoulder mass    right  . Wears hearing aid in both ears   . Wears partial dentures    upper    Past Surgical History: Past Surgical History:  Procedure Laterality Date  . BREAST LUMPECTOMY Bilateral right 1999;  left 04/ 2000  . CATARACT EXTRACTION W/ INTRAOCULAR LENS  IMPLANT, BILATERAL  yrs ago  . KNEE ARTHROSCOPY W/ MENISCAL REPAIR Right 2018  . LESION EXCISION WITH COMPLEX REPAIR Bilateral 03/11/2019   Procedure: Bilateral excision of eye lid xanthelasma;  Surgeon: Wallace Going, DO;  Location: [facility];  Service: Plastics;  Laterality: Bilateral;  1 hour, please  . LUMBAR FUSION  07-07-2001   dr Carloyn Manner @[facility]    L5 --S1   . MASS EXCISION N/A 02/04/2019   Procedure: EXCISION OF RIGHT SHOULDER SKIN MASS AND LEFT ARM SKIN MASS;  Surgeon: Kieth Brightly, Arta Bruce, MD;  Location: [facility];  Service: General;  Laterality: N/A;  . REMOVAL OF URINARY SLING  12-20-2003   dr Jeffie Pollock @[facility]   . TOTAL ABDOMINAL HYSTERECTOMY W/ BILATERAL SALPINGOOPHORECTOMY  1995  . TRANSOBTURATOR SLING  08/2003  . WISDOM TOOTH EXTRACTION      Social History: Social History   Socioeconomic History  .  Marital status: Married    Spouse name: Not on file  . Number of children: Not on file  . Years of education: Not on file  . Highest education level: Not on file  Occupational History  . Not on file  Tobacco Use  . Smoking status: Never Smoker  . Smokeless tobacco: Never Used  Substance and Sexual Activity  . Alcohol use: No  . Drug use: Never  . Sexual activity: Not on file  Other Topics Concern  . Not on file  Social History Narrative  . Not on file   Social Determinants of Health   Financial Resource Strain:   .  Difficulty of Paying Living Expenses: Not on file  Food Insecurity:   . Worried About Charity fundraiser in the Last Year: Not on file  . Ran Out of Food in the Last Year: Not on file  Transportation Needs:   . Lack of Transportation (Medical): Not on file  . Lack of Transportation (Non-Medical): Not on file  Physical Activity:   . Days of Exercise per Week: Not on file  . Minutes of Exercise per Session: Not on file  Stress:   . Feeling of Stress : Not on file  Social Connections:   . Frequency of Communication with Friends and Family: Not on file  . Frequency of Social Gatherings with Friends and Family: Not on file  . Attends Religious Services: Not on file  . Active Member of Clubs or Organizations: Not on file  . Attends Archivist Meetings: Not on file  . Marital Status: Not on file  Intimate Partner Violence:   . Fear of Current or Ex-Partner: Not on file  . Emotionally Abused: Not on file  . Physically Abused: Not on file  . Sexually Abused: Not on file    Family History: Family History  Problem Relation Age of Onset  . Breast cancer Mother   . Breast cancer Maternal Grandmother   . Cancer Sister   . Cervical cancer Maternal Aunt   . Colon cancer Neg Hx   . Rectal cancer Neg Hx   . Stomach cancer Neg Hx     Review of Systems: Review of Systems  Constitutional: Negative for chills and fever.  HENT: Negative for congestion and sore throat.   Respiratory: Negative for cough and shortness of breath.   Cardiovascular: Negative for chest pain.  Gastrointestinal: Positive for abdominal pain (occasional pain with scar contracture). Negative for nausea and vomiting.  Musculoskeletal: Negative for back pain, joint pain, myalgias and neck pain.  Skin: Negative for itching and rash.    Physical Exam: Vital Signs BP (!) 167/82 (BP Location: Left Arm, Patient Position: Sitting, Cuff Size: Normal)   Pulse 74   Temp 97.8 F (36.6 C) (Temporal)   Ht 4\' 11"   (1.499 m)   Wt 138 lb 9.6 oz (62.9 kg)   SpO2 98%   BMI 27.99 kg/m  Physical Exam Constitutional:      Appearance: Normal appearance. She is normal weight.  HENT:     Head: Normocephalic and atraumatic.  Eyes:     Extraocular Movements: Extraocular movements intact.     Comments: Xanthelasmas of below eyes bilaterally  Cardiovascular:     Rate and Rhythm: Normal rate and regular rhythm.     Pulses: Normal pulses.     Heart sounds: Normal heart sounds.  Pulmonary:     Effort: Pulmonary effort is normal.     Breath sounds: Normal  breath sounds. No wheezing, rhonchi or rales.  Abdominal:     General: Bowel sounds are normal.     Palpations: Abdomen is soft.     Comments: Significant irregularity of abdominal skin due to scar contracture. Patient embarrassed to show abdomen.   Musculoskeletal:        General: No swelling. Normal range of motion.     Cervical back: Normal range of motion.  Skin:    General: Skin is warm and dry.     Coloration: Skin is not pale.     Findings: No erythema or rash.  Neurological:     General: No focal deficit present.     Mental Status: She is alert and oriented to person, place, and time.  Psychiatric:        Mood and Affect: Mood normal.        Behavior: Behavior normal.        Thought Content: Thought content normal.        Judgment: Judgment normal.     Assessment/Plan:   Mrs. Wenner scheduled for excision and release of abdominal wall contracture with Dr. Marla Roe.  Risks, benefits, and alternatives of procedure discussed, questions answered and consent obtained.    Smoking status: Non-smoker  Caprini Score: 8 high; Risk Factors include: 79 year old female, BMI greater than 25, history of cancer, and length of planned surgery. Recommendation for mechanical and pharmacological prophylaxis during surgery. Encourage early ambulation.   Postop Rx sent to pharmacy: Keflex.  Patient declined pain medication stating that nothing works for  her.  The [place] was signed into law in 2016 which includes the topic of electronic health records.  This provides immediate access to information in MyChart.  This includes consultation notes, operative notes, office notes, lab results and pathology reports.  If you have any questions about what you read please let us know at your next visit or call us at the office.  We are right here with you.    Electronically signed by: Threasa Heads, PA-C 06/29/2019 4:21 PM

## 2019-06-29 NOTE — Progress Notes (Signed)
ICD-10-CM   1. Scar of abdominal wall  L90.5       Patient ID: Sarah Barrett, female    DOB: Oct 17, 1940, 79 y.o.   MRN: [id number]   History of Present Illness: Sarah Barrett is a 79 y.o.  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  She presents for preoperative evaluation for upcoming procedure, excision and release of abdominal wall contracture, scheduled for 07/21/2019 with Dr. Marla Roe.  Patient reports she has had multiple abdominal surgeries in the past leaving her with contracture and tightening of her abdominal soft tissue in her abdominal wall.  Sometimes the contracture can be painful.  She is finding it difficult to keep the area clean.  PMH significant for bilateral breast cancer approximately 20 years ago, HTN, and HLD.  Patient denies adverse reactions to anesthesia but reports hallucinations and difficulty after being given morphine after recent surgery.  Patient denies personal or family history of blood clots.  Past Medical History: Allergies: Allergies  Allergen Reactions  . Morphine And Related Other (See Comments)    Hallucinations, doesn't like how it made her feel    Current Medications:  Current Outpatient Medications:  .  Calcium Carbonate-Vitamin D (CALCIUM + D PO), Take 1 capsule by mouth daily., Disp: , Rfl:  .  hydroxypropyl methylcellulose / hypromellose (ISOPTO TEARS / GONIOVISC) 2.5 % ophthalmic solution, Place 1 drop into both eyes as needed for dry eyes., Disp: , Rfl:  .  lidocaine-prilocaine (EMLA) cream, Apply 1 application topically as needed. Applied to face 45 minutes prior to procedure, Disp: 30 g, Rfl: 0 .  Multiple Vitamins-Minerals (MULTIVITAMIN PO), Take 1 capsule by mouth daily., Disp: , Rfl:  .  Omega-3 Fatty Acids (FISH OIL CONCENTRATE PO), Take 1 capsule by mouth daily., Disp: , Rfl:   Past Medical Problems: Past Medical History:  Diagnosis Date  . Arm mass, right   . History of  bilateral breast cancer released from oncologist-- dr Truddie Coco 2010, (02-01-2019 per pt no recurrence)   dx right side 1999 s/p  breast lumpectomy and IMRT;  dx left side 2000 s/p breast lumpectomy and completed IMRT 2000  . OA (osteoarthritis)   . Shoulder mass    right  . Wears hearing aid in both ears   . Wears partial dentures    upper    Past Surgical History: Past Surgical History:  Procedure Laterality Date  . BREAST LUMPECTOMY Bilateral right 1999;  left 04/ 2000  . CATARACT EXTRACTION W/ INTRAOCULAR LENS  IMPLANT, BILATERAL  yrs ago  . KNEE ARTHROSCOPY W/ MENISCAL REPAIR Right 2018  . LESION EXCISION WITH COMPLEX REPAIR Bilateral 03/11/2019   Procedure: Bilateral excision of eye lid xanthelasma;  Surgeon: Wallace Going, DO;  Location: [facility];  Service: Plastics;  Laterality: Bilateral;  1 hour, please  . LUMBAR FUSION  07-07-2001   dr Carloyn Manner @[facility]    L5 --S1   . MASS EXCISION N/A 02/04/2019   Procedure: EXCISION OF RIGHT SHOULDER SKIN MASS AND LEFT ARM SKIN MASS;  Surgeon: Kieth Brightly, Arta Bruce, MD;  Location: [facility];  Service: General;  Laterality: N/A;  . REMOVAL OF URINARY SLING  12-20-2003   dr Jeffie Pollock @[facility]   . TOTAL ABDOMINAL HYSTERECTOMY W/ BILATERAL SALPINGOOPHORECTOMY  1995  . TRANSOBTURATOR SLING  08/2003  . WISDOM TOOTH EXTRACTION      Social History: Social History   Socioeconomic History  .  Marital status: Married    Spouse name: Not on file  . Number of children: Not on file  . Years of education: Not on file  . Highest education level: Not on file  Occupational History  . Not on file  Tobacco Use  . Smoking status: Never Smoker  . Smokeless tobacco: Never Used  Substance and Sexual Activity  . Alcohol use: No  . Drug use: Never  . Sexual activity: Not on file  Other Topics Concern  . Not on file  Social History Narrative  . Not on file   Social Determinants of Health   Financial Resource Strain:   .  Difficulty of Paying Living Expenses: Not on file  Food Insecurity:   . Worried About Charity fundraiser in the Last Year: Not on file  . Ran Out of Food in the Last Year: Not on file  Transportation Needs:   . Lack of Transportation (Medical): Not on file  . Lack of Transportation (Non-Medical): Not on file  Physical Activity:   . Days of Exercise per Week: Not on file  . Minutes of Exercise per Session: Not on file  Stress:   . Feeling of Stress : Not on file  Social Connections:   . Frequency of Communication with Friends and Family: Not on file  . Frequency of Social Gatherings with Friends and Family: Not on file  . Attends Religious Services: Not on file  . Active Member of Clubs or Organizations: Not on file  . Attends Archivist Meetings: Not on file  . Marital Status: Not on file  Intimate Partner Violence:   . Fear of Current or Ex-Partner: Not on file  . Emotionally Abused: Not on file  . Physically Abused: Not on file  . Sexually Abused: Not on file    Family History: Family History  Problem Relation Age of Onset  . Breast cancer Mother   . Breast cancer Maternal Grandmother   . Cancer Sister   . Cervical cancer Maternal Aunt   . Colon cancer Neg Hx   . Rectal cancer Neg Hx   . Stomach cancer Neg Hx     Review of Systems: Review of Systems  Constitutional: Negative for chills and fever.  HENT: Negative for congestion and sore throat.   Respiratory: Negative for cough and shortness of breath.   Cardiovascular: Negative for chest pain.  Gastrointestinal: Positive for abdominal pain (occasional pain with scar contracture). Negative for nausea and vomiting.  Musculoskeletal: Negative for back pain, joint pain, myalgias and neck pain.  Skin: Negative for itching and rash.    Physical Exam: Vital Signs BP (!) 167/82 (BP Location: Left Arm, Patient Position: Sitting, Cuff Size: Normal)   Pulse 74   Temp 97.8 F (36.6 C) (Temporal)   Ht 4\' 11"   (1.499 m)   Wt 138 lb 9.6 oz (62.9 kg)   SpO2 98%   BMI 27.99 kg/m  Physical Exam Constitutional:      Appearance: Normal appearance. She is normal weight.  HENT:     Head: Normocephalic and atraumatic.  Eyes:     Extraocular Movements: Extraocular movements intact.     Comments: Xanthelasmas of below eyes bilaterally  Cardiovascular:     Rate and Rhythm: Normal rate and regular rhythm.     Pulses: Normal pulses.     Heart sounds: Normal heart sounds.  Pulmonary:     Effort: Pulmonary effort is normal.     Breath sounds: Normal  breath sounds. No wheezing, rhonchi or rales.  Abdominal:     General: Bowel sounds are normal.     Palpations: Abdomen is soft.     Comments: Significant irregularity of abdominal skin due to scar contracture. Patient embarrassed to show abdomen.   Musculoskeletal:        General: No swelling. Normal range of motion.     Cervical back: Normal range of motion.  Skin:    General: Skin is warm and dry.     Coloration: Skin is not pale.     Findings: No erythema or rash.  Neurological:     General: No focal deficit present.     Mental Status: She is alert and oriented to person, place, and time.  Psychiatric:        Mood and Affect: Mood normal.        Behavior: Behavior normal.        Thought Content: Thought content normal.        Judgment: Judgment normal.     Assessment/Plan:   Mrs. Stivers scheduled for excision and release of abdominal wall contracture with Dr. Marla Roe.  Risks, benefits, and alternatives of procedure discussed, questions answered and consent obtained.    Smoking status: Non-smoker  Caprini Score: 8 high; Risk Factors include: 79 year old female, BMI greater than 25, history of cancer, and length of planned surgery. Recommendation for mechanical and pharmacological prophylaxis during surgery. Encourage early ambulation.   Postop Rx sent to pharmacy: Keflex.  Patient declined pain medication stating that nothing works for  her.  The [place] was signed into law in 2016 which includes the topic of electronic health records.  This provides immediate access to information in MyChart.  This includes consultation notes, operative notes, office notes, lab results and pathology reports.  If you have any questions about what you read please let us know at your next visit or call us at the office.  We are right here with you.    Electronically signed by: Threasa Heads, PA-C 06/29/2019 4:21 PM

## 2019-07-04 ENCOUNTER — Ambulatory Visit: Payer: Medicare HMO | Attending: Internal Medicine

## 2019-07-04 ENCOUNTER — Ambulatory Visit: Payer: Medicare HMO

## 2019-07-04 DIAGNOSIS — Z23 Encounter for immunization: Secondary | ICD-10-CM | POA: Insufficient documentation

## 2019-07-04 NOTE — Progress Notes (Signed)
   Covid-19 Vaccination Clinic  Name:  Sarah Barrett    MRN: [id number] DOB: 1941/05/02  07/04/2019  [name] was observed post Covid-19 immunization for 15 minutes without incidence. She was provided with Vaccine Information Sheet and instruction to access the V-Safe system.   [name] was instructed to call 911 with any severe reactions post vaccine: Marland Kitchen Difficulty breathing  . Swelling of your face and throat  . A fast heartbeat  . A bad rash all over your body  . Dizziness and weakness    Immunizations Administered    Name Date Dose VIS Date Route   Pfizer COVID-19 Vaccine 07/04/2019  1:34 PM 0.3 mL 04/30/2019 Intramuscular   Manufacturer: [place]   Lot: [id number]   [place]: [id number]

## 2019-07-15 ENCOUNTER — Encounter (HOSPITAL_BASED_OUTPATIENT_CLINIC_OR_DEPARTMENT_OTHER): Payer: Self-pay | Admitting: Plastic Surgery

## 2019-07-15 ENCOUNTER — Other Ambulatory Visit: Payer: Self-pay

## 2019-07-17 ENCOUNTER — Other Ambulatory Visit (HOSPITAL_COMMUNITY)
Admission: RE | Admit: 2019-07-17 | Discharge: 2019-07-17 | Disposition: A | Discharge status: Home / Self Care | Payer: Medicare HMO | Source: Ambulatory Visit | Attending: Plastic Surgery | Admitting: Plastic Surgery

## 2019-07-17 DIAGNOSIS — Z20822 Contact with and (suspected) exposure to covid-19: Secondary | ICD-10-CM | POA: Insufficient documentation

## 2019-07-17 DIAGNOSIS — Z01812 Encounter for preprocedural laboratory examination: Secondary | ICD-10-CM | POA: Diagnosis not present

## 2019-07-17 LAB — SARS CORONAVIRUS 2 (TAT 6-24 HRS): SARS Coronavirus 2: NEGATIVE

## 2019-07-21 ENCOUNTER — Ambulatory Visit (HOSPITAL_BASED_OUTPATIENT_CLINIC_OR_DEPARTMENT_OTHER): Payer: Medicare HMO | Admitting: Anesthesiology

## 2019-07-21 ENCOUNTER — Other Ambulatory Visit (INDEPENDENT_AMBULATORY_CARE_PROVIDER_SITE_OTHER): Payer: Medicare HMO | Admitting: Plastic Surgery

## 2019-07-21 ENCOUNTER — Ambulatory Visit (HOSPITAL_BASED_OUTPATIENT_CLINIC_OR_DEPARTMENT_OTHER)
Admission: RE | Admit: 2019-07-21 | Discharge: 2019-07-21 | Disposition: A | Discharge status: Home / Self Care | Payer: Medicare HMO | Attending: Plastic Surgery | Admitting: Plastic Surgery

## 2019-07-21 ENCOUNTER — Other Ambulatory Visit: Payer: Self-pay

## 2019-07-21 ENCOUNTER — Encounter (HOSPITAL_BASED_OUTPATIENT_CLINIC_OR_DEPARTMENT_OTHER)
Admission: RE | Disposition: A | Discharge status: Home / Self Care | Payer: Self-pay | Source: Home / Self Care | Attending: Plastic Surgery

## 2019-07-21 ENCOUNTER — Encounter (HOSPITAL_BASED_OUTPATIENT_CLINIC_OR_DEPARTMENT_OTHER): Payer: Self-pay | Admitting: Plastic Surgery

## 2019-07-21 DIAGNOSIS — Z8049 Family history of malignant neoplasm of other genital organs: Secondary | ICD-10-CM | POA: Diagnosis not present

## 2019-07-21 DIAGNOSIS — Z885 Allergy status to narcotic agent status: Secondary | ICD-10-CM | POA: Insufficient documentation

## 2019-07-21 DIAGNOSIS — M1990 Unspecified osteoarthritis, unspecified site: Secondary | ICD-10-CM | POA: Diagnosis not present

## 2019-07-21 DIAGNOSIS — Z9841 Cataract extraction status, right eye: Secondary | ICD-10-CM | POA: Insufficient documentation

## 2019-07-21 DIAGNOSIS — Z809 Family history of malignant neoplasm, unspecified: Secondary | ICD-10-CM | POA: Insufficient documentation

## 2019-07-21 DIAGNOSIS — I10 Essential (primary) hypertension: Secondary | ICD-10-CM | POA: Insufficient documentation

## 2019-07-21 DIAGNOSIS — Z803 Family history of malignant neoplasm of breast: Secondary | ICD-10-CM | POA: Insufficient documentation

## 2019-07-21 DIAGNOSIS — L905 Scar conditions and fibrosis of skin: Secondary | ICD-10-CM | POA: Diagnosis not present

## 2019-07-21 DIAGNOSIS — Z853 Personal history of malignant neoplasm of breast: Secondary | ICD-10-CM | POA: Insufficient documentation

## 2019-07-21 DIAGNOSIS — Z961 Presence of intraocular lens: Secondary | ICD-10-CM | POA: Insufficient documentation

## 2019-07-21 DIAGNOSIS — Z79899 Other long term (current) drug therapy: Secondary | ICD-10-CM | POA: Diagnosis not present

## 2019-07-21 DIAGNOSIS — Z90710 Acquired absence of both cervix and uterus: Secondary | ICD-10-CM | POA: Insufficient documentation

## 2019-07-21 DIAGNOSIS — E785 Hyperlipidemia, unspecified: Secondary | ICD-10-CM | POA: Insufficient documentation

## 2019-07-21 DIAGNOSIS — Z9842 Cataract extraction status, left eye: Secondary | ICD-10-CM | POA: Diagnosis not present

## 2019-07-21 HISTORY — PX: SCAR REVISION: SHX5285

## 2019-07-21 SURGERY — REVISION, SCAR
Anesthesia: General | Site: Abdomen

## 2019-07-21 MED ORDER — LIDOCAINE-EPINEPHRINE 1 %-1:100000 IJ SOLN
INTRAMUSCULAR | Status: DC | PRN
  Administered 2019-07-21: 20 mL

## 2019-07-21 MED ORDER — MIDAZOLAM HCL 2 MG/2ML IJ SOLN: 1.0000 mg | INTRAMUSCULAR | Status: DC | PRN

## 2019-07-21 MED ORDER — FENTANYL CITRATE (PF) 100 MCG/2ML IJ SOLN
50.0000 ug | INTRAMUSCULAR | Status: DC | PRN
  Administered 2019-07-21: 50 ug via INTRAVENOUS

## 2019-07-21 MED ORDER — FENTANYL CITRATE (PF) 100 MCG/2ML IJ SOLN
25.0000 ug | INTRAMUSCULAR | Status: DC | PRN
  Administered 2019-07-21: 12:00:00 25 ug via INTRAVENOUS
  Administered 2019-07-21: 50 ug via INTRAVENOUS
  Administered 2019-07-21: 25 ug via INTRAVENOUS
  Administered 2019-07-21: 10:00:00 50 ug via INTRAVENOUS

## 2019-07-21 MED ORDER — CEFAZOLIN SODIUM-DEXTROSE 2-4 GM/100ML-% IV SOLN
2.0000 g | INTRAVENOUS | Status: AC
  Administered 2019-07-21: 2 g via INTRAVENOUS

## 2019-07-21 MED ORDER — BUPIVACAINE HCL (PF) 0.5 % IJ SOLN
INTRAMUSCULAR | Status: AC
  Filled 2019-07-21: qty 30

## 2019-07-21 MED ORDER — SODIUM CHLORIDE (PF) 0.9 % IJ SOLN
INTRAMUSCULAR | Status: AC
  Filled 2019-07-21: qty 10

## 2019-07-21 MED ORDER — PROPOFOL 500 MG/50ML IV EMUL
INTRAVENOUS | Status: AC
  Filled 2019-07-21: qty 50

## 2019-07-21 MED ORDER — FENTANYL CITRATE (PF) 100 MCG/2ML IJ SOLN
INTRAMUSCULAR | Status: AC
  Filled 2019-07-21: qty 2

## 2019-07-21 MED ORDER — EPINEPHRINE PF 1 MG/ML IJ SOLN
INTRAMUSCULAR | Status: AC
  Filled 2019-07-21: qty 1

## 2019-07-21 MED ORDER — BUPIVACAINE HCL (PF) 0.25 % IJ SOLN
INTRAMUSCULAR | Status: AC
  Filled 2019-07-21: qty 30

## 2019-07-21 MED ORDER — ACETAMINOPHEN 325 MG PO TABS: 650.0000 mg | ORAL_TABLET | ORAL | Status: DC | PRN

## 2019-07-21 MED ORDER — BUPIVACAINE-EPINEPHRINE (PF) 0.25% -1:200000 IJ SOLN
INTRAMUSCULAR | Status: DC | PRN
  Administered 2019-07-21: 20 mL via PERINEURAL

## 2019-07-21 MED ORDER — LIDOCAINE 2% (20 MG/ML) 5 ML SYRINGE
INTRAMUSCULAR | Status: AC
  Filled 2019-07-21: qty 5

## 2019-07-21 MED ORDER — OXYCODONE HCL 5 MG PO TABS
ORAL_TABLET | ORAL | Status: AC
  Filled 2019-07-21: qty 1

## 2019-07-21 MED ORDER — DEXAMETHASONE SODIUM PHOSPHATE 10 MG/ML IJ SOLN
INTRAMUSCULAR | Status: AC
  Filled 2019-07-21: qty 1

## 2019-07-21 MED ORDER — CEFAZOLIN SODIUM-DEXTROSE 2-4 GM/100ML-% IV SOLN
INTRAVENOUS | Status: AC
  Filled 2019-07-21: qty 100

## 2019-07-21 MED ORDER — SODIUM CHLORIDE 0.9 % IV SOLN: 250.0000 mL | INTRAVENOUS | Status: DC | PRN

## 2019-07-21 MED ORDER — DEXAMETHASONE SODIUM PHOSPHATE 4 MG/ML IJ SOLN
INTRAMUSCULAR | Status: DC | PRN
  Administered 2019-07-21: 5 mg via INTRAVENOUS

## 2019-07-21 MED ORDER — ONDANSETRON HCL 4 MG/2ML IJ SOLN
INTRAMUSCULAR | Status: DC | PRN
  Administered 2019-07-21: 4 mg via INTRAVENOUS

## 2019-07-21 MED ORDER — OXYCODONE HCL 5 MG/5ML PO SOLN: 5.0000 mg | Freq: Once | ORAL | Status: AC | PRN

## 2019-07-21 MED ORDER — SODIUM CHLORIDE 0.9% FLUSH: 3.0000 mL | Freq: Two times a day (BID) | INTRAVENOUS | Status: DC

## 2019-07-21 MED ORDER — PROMETHAZINE HCL 25 MG/ML IJ SOLN: 6.2500 mg | INTRAMUSCULAR | Status: DC | PRN

## 2019-07-21 MED ORDER — OXYCODONE HCL 5 MG PO TABS
5.0000 mg | ORAL_TABLET | Freq: Once | ORAL | Status: AC | PRN
  Administered 2019-07-21: 5 mg via ORAL

## 2019-07-21 MED ORDER — LIDOCAINE-EPINEPHRINE 1 %-1:100000 IJ SOLN
INTRAMUSCULAR | Status: AC
  Filled 2019-07-21: qty 1

## 2019-07-21 MED ORDER — LACTATED RINGERS IV SOLN: INTRAVENOUS | Status: DC

## 2019-07-21 MED ORDER — MIDAZOLAM HCL 2 MG/2ML IJ SOLN
INTRAMUSCULAR | Status: AC
  Filled 2019-07-21: qty 2

## 2019-07-21 MED ORDER — BACITRACIN ZINC 500 UNIT/GM EX OINT
TOPICAL_OINTMENT | CUTANEOUS | Status: AC
  Filled 2019-07-21: qty 28.35

## 2019-07-21 MED ORDER — PHENYLEPHRINE HCL-NACL 10-0.9 MG/250ML-% IV SOLN
INTRAVENOUS | Status: DC | PRN
  Administered 2019-07-21: 30 ug/min via INTRAVENOUS

## 2019-07-21 MED ORDER — PHENYLEPHRINE 40 MCG/ML (10ML) SYRINGE FOR IV PUSH (FOR BLOOD PRESSURE SUPPORT)
PREFILLED_SYRINGE | INTRAVENOUS | Status: AC
  Filled 2019-07-21: qty 10

## 2019-07-21 MED ORDER — SUCCINYLCHOLINE CHLORIDE 200 MG/10ML IV SOSY
PREFILLED_SYRINGE | INTRAVENOUS | Status: AC
  Filled 2019-07-21: qty 10

## 2019-07-21 MED ORDER — ONDANSETRON HCL 4 MG/2ML IJ SOLN
INTRAMUSCULAR | Status: AC
  Filled 2019-07-21: qty 2

## 2019-07-21 MED ORDER — SODIUM CHLORIDE 0.9 % IV SOLN
INTRAVENOUS | Status: AC
  Filled 2019-07-21: qty 500000

## 2019-07-21 MED ORDER — SODIUM CHLORIDE 0.9% FLUSH: 3.0000 mL | INTRAVENOUS | Status: DC | PRN

## 2019-07-21 MED ORDER — BACITRACIN ZINC 500 UNIT/GM EX OINT
TOPICAL_OINTMENT | CUTANEOUS | Status: AC
  Filled 2019-07-21: qty 0.9

## 2019-07-21 MED ORDER — ACETAMINOPHEN 10 MG/ML IV SOLN: 1000.0000 mg | Freq: Once | INTRAVENOUS | Status: DC | PRN

## 2019-07-21 MED ORDER — ACETAMINOPHEN 650 MG RE SUPP: 650.0000 mg | RECTAL | Status: DC | PRN

## 2019-07-21 MED ORDER — LIDOCAINE HCL (CARDIAC) PF 100 MG/5ML IV SOSY
PREFILLED_SYRINGE | INTRAVENOUS | Status: DC | PRN
  Administered 2019-07-21: 30 mg via INTRAVENOUS

## 2019-07-21 MED ORDER — LIDOCAINE HCL (PF) 1 % IJ SOLN
INTRAMUSCULAR | Status: AC
  Filled 2019-07-21: qty 30

## 2019-07-21 MED ORDER — CHLORHEXIDINE GLUCONATE CLOTH 2 % EX PADS: 6.0000 | MEDICATED_PAD | Freq: Once | CUTANEOUS | Status: DC

## 2019-07-21 MED ORDER — EPHEDRINE 5 MG/ML INJ
INTRAVENOUS | Status: AC
  Filled 2019-07-21: qty 10

## 2019-07-21 MED ORDER — FENTANYL CITRATE (PF) 100 MCG/2ML IJ SOLN: 25.0000 ug | INTRAMUSCULAR | Status: DC | PRN

## 2019-07-21 MED ORDER — EPHEDRINE SULFATE 50 MG/ML IJ SOLN
INTRAMUSCULAR | Status: DC | PRN
  Administered 2019-07-21 (×2): 10 mg via INTRAVENOUS

## 2019-07-21 MED ORDER — HYDROCODONE-ACETAMINOPHEN 5-325 MG PO TABS: 1.0000 | ORAL_TABLET | Freq: Three times a day (TID) | ORAL | 0 refills | Status: AC | PRN

## 2019-07-21 SURGICAL SUPPLY — 66 items
BAG DECANTER FOR FLEXI CONT (MISCELLANEOUS) IMPLANT
BINDER ABDOMINAL  9 SM 30-45 (SOFTGOODS) ×3
BINDER ABDOMINAL 9 SM 30-45 (SOFTGOODS) ×1 IMPLANT
BIOPATCH RED 1 DISK 7.0 (GAUZE/BANDAGES/DRESSINGS) ×2 IMPLANT
BIOPATCH RED 1IN DISK 7.0MM (GAUZE/BANDAGES/DRESSINGS) ×1
BLADE HEX COATED 2.75 (ELECTRODE) IMPLANT
BLADE SURG 10 STRL SS (BLADE) ×3 IMPLANT
BLADE SURG 15 STRL LF DISP TIS (BLADE) ×1 IMPLANT
BLADE SURG 15 STRL SS (BLADE) ×3
CANISTER SUCT 1200ML W/VALVE (MISCELLANEOUS) IMPLANT
CLOSURE WOUND 1/2 X4 (GAUZE/BANDAGES/DRESSINGS)
COVER BACK TABLE 60X90IN (DRAPES) ×3 IMPLANT
COVER MAYO STAND STRL (DRAPES) ×3 IMPLANT
COVER WAND RF STERILE (DRAPES) IMPLANT
DECANTER SPIKE VIAL GLASS SM (MISCELLANEOUS) IMPLANT
DERMABOND ADVANCED (GAUZE/BANDAGES/DRESSINGS) ×4
DERMABOND ADVANCED .7 DNX12 (GAUZE/BANDAGES/DRESSINGS) ×2 IMPLANT
DRAIN CHANNEL 19F RND (DRAIN) ×3 IMPLANT
DRAIN JACKSON RD 7FR 3/32 (WOUND CARE) IMPLANT
DRAPE LAPAROSCOPIC ABDOMINAL (DRAPES) ×3 IMPLANT
DRAPE LAPAROTOMY 100X72 PEDS (DRAPES) IMPLANT
DRSG OPSITE POSTOP 4X6 (GAUZE/BANDAGES/DRESSINGS) ×9 IMPLANT
DRSG PAD ABDOMINAL 8X10 ST (GAUZE/BANDAGES/DRESSINGS) ×9 IMPLANT
ELECT BLADE 4.0 EZ CLEAN MEGAD (MISCELLANEOUS) ×3
ELECT REM PT RETURN 9FT ADLT (ELECTROSURGICAL) ×3
ELECTRODE BLDE 4.0 EZ CLN MEGD (MISCELLANEOUS) ×1 IMPLANT
ELECTRODE REM PT RTRN 9FT ADLT (ELECTROSURGICAL) ×1 IMPLANT
EVACUATOR SILICONE 100CC (DRAIN) ×3 IMPLANT
GAUZE SPONGE 4X4 12PLY STRL (GAUZE/BANDAGES/DRESSINGS) ×3 IMPLANT
GAUZE SPONGE 4X4 12PLY STRL LF (GAUZE/BANDAGES/DRESSINGS) ×3 IMPLANT
GLOVE BIO SURGEON STRL SZ 6 (GLOVE) ×3 IMPLANT
GLOVE BIO SURGEON STRL SZ 6.5 (GLOVE) ×4 IMPLANT
GLOVE BIO SURGEONS STRL SZ 6.5 (GLOVE) ×2
GLOVE BIOGEL PI IND STRL 6.5 (GLOVE) ×1 IMPLANT
GLOVE BIOGEL PI INDICATOR 6.5 (GLOVE) ×2
GOWN STRL REUS W/ TWL LRG LVL3 (GOWN DISPOSABLE) ×1 IMPLANT
GOWN STRL REUS W/TWL LRG LVL3 (GOWN DISPOSABLE) ×3
HANDPIECE INTERPULSE COAX TIP (DISPOSABLE)
IV NS IRRIG 3000ML ARTHROMATIC (IV SOLUTION) IMPLANT
MANIFOLD NEPTUNE II (INSTRUMENTS) IMPLANT
NEEDLE HYPO 25X1 1.5 SAFETY (NEEDLE) ×3 IMPLANT
NS IRRIG 1000ML POUR BTL (IV SOLUTION) ×3 IMPLANT
PACK BASIN DAY SURGERY FS (CUSTOM PROCEDURE TRAY) ×3 IMPLANT
PENCIL SMOKE EVACUATOR (MISCELLANEOUS) ×3 IMPLANT
SET HNDPC FAN SPRY TIP SCT (DISPOSABLE) IMPLANT
SLEEVE SCD COMPRESS KNEE MED (MISCELLANEOUS) IMPLANT
SPONGE LAP 18X18 RF (DISPOSABLE) ×6 IMPLANT
SPONGE LAP 4X18 RFD (DISPOSABLE) IMPLANT
STAPLER VISISTAT 35W (STAPLE) IMPLANT
STRIP CLOSURE SKIN 1/2X4 (GAUZE/BANDAGES/DRESSINGS) IMPLANT
SURGILUBE 2OZ TUBE FLIPTOP (MISCELLANEOUS) IMPLANT
SUT MNCRL AB 4-0 PS2 18 (SUTURE) ×6 IMPLANT
SUT MON AB 3-0 SH 27 (SUTURE) ×6
SUT MON AB 3-0 SH27 (SUTURE) ×2 IMPLANT
SUT MON AB 5-0 PS2 18 (SUTURE) ×9 IMPLANT
SUT SILK 4 0 PS 2 (SUTURE) ×3 IMPLANT
SYR BULB IRRIGATION 50ML (SYRINGE) ×3 IMPLANT
SYR CONTROL 10ML LL (SYRINGE) ×3 IMPLANT
TAPE HYPAFIX 6 X30' (GAUZE/BANDAGES/DRESSINGS)
TAPE HYPAFIX 6X30 (GAUZE/BANDAGES/DRESSINGS) IMPLANT
TOWEL GREEN STERILE FF (TOWEL DISPOSABLE) ×6 IMPLANT
TRAY DSU PREP LF (CUSTOM PROCEDURE TRAY) ×3 IMPLANT
TUBE CONNECTING 20'X1/4 (TUBING)
TUBE CONNECTING 20X1/4 (TUBING) IMPLANT
UNDERPAD 30X36 HEAVY ABSORB (UNDERPADS AND DIAPERS) ×3 IMPLANT
YANKAUER SUCT BULB TIP NO VENT (SUCTIONS) ×3 IMPLANT

## 2019-07-21 NOTE — Interval H&P Note (Signed)
History and Physical Interval Note:  07/21/2019 7:36 AM  Sarah Barrett  has presented today for surgery, with the diagnosis of scar of abdominal wall.  The various methods of treatment have been discussed with the patient and family. After consideration of risks, benefits and other options for treatment, the patient has consented to  Procedure(s) with comments: Excision and release of abdominal wall contracture (N/A) - 2 hours as a surgical intervention.  The patient's history has been reviewed, patient examined, no change in status, stable for surgery.  I have reviewed the patient's chart and labs.  Questions were answered to the patient's satisfaction.     Loel Lofty Arvilla Salada

## 2019-07-21 NOTE — Anesthesia Procedure Notes (Signed)
Procedure Name: LMA Insertion Date/Time: 07/21/2019 8:31 AM Performed by: Willa Frater, CRNA Pre-anesthesia Checklist: Patient identified, Emergency Drugs available, Suction available and Patient being monitored Patient Re-evaluated:Patient Re-evaluated prior to induction Oxygen Delivery Method: Circle system utilized Preoxygenation: Pre-oxygenation with 100% oxygen Induction Type: IV induction Ventilation: Mask ventilation without difficulty LMA: LMA inserted LMA Size: 4.0 Number of attempts: 1 Airway Equipment and Method: Bite block Placement Confirmation: positive ETCO2 Tube secured with: Tape Dental Injury: Teeth and Oropharynx as per pre-operative assessment

## 2019-07-21 NOTE — Anesthesia Postprocedure Evaluation (Signed)
Anesthesia Post Note  Patient: Sarah Barrett  Procedure(s) Performed: Excision and release of abdominal wall contracture (N/A Abdomen)     Patient location during evaluation: PACU Anesthesia Type: General Level of consciousness: awake and alert Pain management: pain level controlled Vital Signs Assessment: post-procedure vital signs reviewed and stable Respiratory status: spontaneous breathing, nonlabored ventilation, respiratory function stable and patient connected to nasal cannula oxygen Cardiovascular status: blood pressure returned to baseline and stable Postop Assessment: no apparent nausea or vomiting Anesthetic complications: no    Last Vitals:  Vitals:   07/21/19 1145 07/21/19 1155  BP: 124/64   Pulse: 87 89  Resp: (!) 22 15  Temp:    SpO2: 96% 97%    Last Pain:  Vitals:   07/21/19 1130  TempSrc:   PainSc: 8                  Cloa Bushong S

## 2019-07-21 NOTE — Progress Notes (Signed)
Post-op pain medication sent to pharmacy.

## 2019-07-21 NOTE — Discharge Instructions (Signed)
May take next pain pill at 7:30 p this evening, then every 8 hours as needed.   [facility] Plastic Surgery Specialist  What is the benefit of having a drain?  During surgery your tissue layers are separated.  This raw surface stimulates your body to fill the space with serous fluid.  This is normal but you don't want that fluid to collect and prevent healing.  A fluid collection can also become infected.  The Jackson-Pratt (JP) drain is used to eliminate this collection of fluid and allow the tissue to heal together.    Jackson-Pratt (JP) bulb    How to care for your drainage and suction unit at home Your drainage catheter will be connected to a collection device. The vacuum caused when the device is compressed allows drainage to collect in the device.    Wendee Copp your hands with soap and water before and after touching the system. . Empty the JP drain every 12 hours once you get home from your procedure. . Record the fluid amount on the record sheet included. . Start with stripping the drain tube to push the clots or excess fluid to the bulb.  Do this by pinching the tube with one hand near your skin.  Then with the other hand squeeze the tubing and work it toward the bulb.  This should be done several times a day.  This may collapse the tube which will correct on its own.   . Use a safety pin to attach your collection device to your clothing so there is no tension on the insertion site.   . If you have drainage at the skin insertion site, you can apply a gauze dressing and secure it with tape. . If the drain falls out, apply a gauze dressing over the drain insertion site and secure with tape.   To empty the collection device:   . Release the stopper on the top of the collection unit (bulb).  Signa Kell contents into a measuring container such as a plastic medicine cup.  . Record the day and amount of drainage on the attached sheet. . This should be done at least twice a day.    To compress the  Jackson-Pratt Bulb:  . Release the stopper at the top of the bulb. Marland Kitchen Squeeze the bulb tightly in your fist, squeezing air out of the bulb.  . Replace the stopper while the bulb is compressed.  . Be careful not to spill the contents when squeezing the bulb. . The drainage will start bright red and turn to pink and then yellow with time. . IMPORTANT: If the bulb is not squeezed before adding the stopper it will not draw out the fluid.  Care for the JP drain site and your skin daily:  . You may shower three days after surgery. . Secure the drain to a ribbon or cloth around your waist while showering so it does not pull out while showering. . Be sure your hands are cleaned with soap and water. . Use a clean wet cotton swab to clean the skin around the drain site.  . Use another cotton swab to place Vaseline or antibiotic ointment on the skin around the drain.     Contact your physician if any of the following occur:  Marland Kitchen The fluid in the bulb becomes cloudy. . Your temperature is greater than 101.4.  Marland Kitchen The incision opens. . If you have drainage at the skin insertion site, you can apply a gauze  dressing and secure it with tape. . If the drain falls out, apply a gauze dressing over the drain insertion site and secure with tape.  . You will usually have more drainage when you are active than while you rest or are asleep. If the drainage increases significantly or is bloody call the physician                             Bring this record with you to each office visit Date  Drainage Volume  Date   Drainage volume                                                                                                                                                                                         DIET  After most surgeries, a specific diet is not required, but it is important to eat a healthier diet to improve your chances of a quicker healing process. Eating a healthier diet  means limiting foods with high sugar and carbohydrates. It is important to increase your protein intake. Focus on vegetables, meats and other protein sources if you are vegetarian or vegan. If you have any liposuction during your procedure be sure to drink water. If your urine is bright yellow, then it is concentrated, and you need to drink more water. As a general rule after surgery, you should have 8 ounces of water every hour while awake. If you find you are persistently nauseated or unable to take in liquids let us know. NO TOBACCO USE or EXPOSURE. This will slow your healing process and increase the risk of a wound.  WOUND CARE  If you don't have a drain: You can shower the day after surgery. Use fragrance free soap. [place], [place] and [place] are usually mild on the skin.  If you have a drain: You can shower five days after surgery. Clean with baby wipes until the drain is removed.  If you have steri-strips / tape directly attached to your skin leave them in place. It is OK to get these wet. No baths, pools or hot tubs for two weeks. No baths, pools or hot tubs if you have any open wounds - It is safest to ask your surgeon or PA prior to submerging any incisions.  We close your incision to leave the smallest and best-looking scar. No ointment or creams on your incisions until given the go ahead. Especially not Neosporin (Too many skin reactions with this one). A few weeks after surgery you can use Mederma and start massaging the scar.  If you were provided a binder after surgery  or ACE wrap, we ask you to wear these for the first 6 weeks around the clock, including while sleeping. This provides added comfort and helps reduce the fluid accumulation at the surgery site.  ACTIVITY  No heavy lifting until cleared by the doctor. This usually means no more than a half-gallon of milk. It is OK to walk and climb stairs. In fact, moving your legs is very important to decrease your risk of a blood clot. It  will also help keep you from getting deconditioned. Every 1 to 2 hours get up and walk for 5 minutes. This will help with a quicker recovery back to normal. Let pain be your guide so you don't do too much. This is not the time for spring cleaning and don't plan on taking care of anyone else. This time is for you to recover.  SLEEPING/RESTING  Sleeping/resting in a jack-knife or slightly bent forward position with your head elevated will be helpful in reducing pain, reducing tension on your incision and reduce risks of developing wounds. You can incorporate this into your sleep/rest by elevating your head/upper back with a few pillows or using a recliner. Avoid sleeping on your stomach for 3 months.  WORK  Everyone returns to work at different times. As a rough guide, most people take at least 1 - 2 weeks off prior to returning to work. If you need documentation for your job, bring the forms to your postoperative follow up visit.  DRIVING  Arrange for someone to bring you home from the hospital. You may be able to drive a few days after surgery but not while taking any narcotics, valium or if you feel like you are unable to react to traffic safely.  BOWEL MOVEMENTS  Constipation can occur after anesthesia and while taking pain medication. It is important to stay ahead for your comfort. We recommend taking Milk of Magnesia (2 tablespoons; twice a day) while taking the pain pills.  SEROMA  This is a fluid collection that occurs when your body accumulates fluid underneath the skin at the surgical site. This is normal but if it creates tight skin or you noticed an increase in swelling let us know. It usually decreases in a few weeks. The chances of seroma formation can be decreased by wearing your abdominal binder 24/7 for 6 weeks.  MEDICATIONS and PAIN CONTROL  At your preoperative visit for you history and physical you were given the following medications:  1. An antibiotic: Start this  medication when you get home and take according to the instructions on the bottle.  2. Zofran 4 mg: This is to treat nausea and vomiting. You can take this every 6 hours as needed and only if needed.  3. Norco (hydrocodone/acetaminophen) 5/325 mg: This is only to be used after you have taken the motrin or the tylenol. Every 8 hours as needed.  Over the counter Medication to take:  4. Ibuprofen (Motrin) 600 mg: Take this every 6 hours. If you have additional pain then take 500 mg of the Tylenol every 8 hours. Only take the Norco after you have tried these two.  5. Miralax or stool softener of choice: Take this according to the bottle if you take the [place]  Call your surgeon's office if any of the following occur:   Fever 101 degrees F or greater   Excessive bleeding or fluid from the incision site.   Pain that increases over time without aid from the medications  Redness, warmth, or pus draining from incision sites   Persistent nausea or inability to take in liquids   Severe misshapen area that underwent the operation.   Post Anesthesia Home Care Instructions  Activity: Get plenty of rest for the remainder of the day. A responsible individual must stay with you for 24 hours following the procedure.  For the next 24 hours, DO NOT: -Drive a car -Paediatric nurse -Drink alcoholic beverages -Take any medication unless instructed by your physician -Make any legal decisions or sign important papers.  Meals: Start with liquid foods such as gelatin or soup. Progress to regular foods as tolerated. Avoid greasy, spicy, heavy foods. If nausea and/or vomiting occur, drink only clear liquids until the nausea and/or vomiting subsides. Call your physician if vomiting continues.  Special Instructions/Symptoms: Your throat may feel dry or sore from the anesthesia or the breathing tube placed in your throat during surgery. If this causes discomfort, gargle with warm  salt water. The discomfort should disappear within 24 hours.  If you had a scopolamine patch placed behind your ear for the management of post- operative nausea and/or vomiting:  1. The medication in the patch is effective for 72 hours, after which it should be removed.  Wrap patch in a tissue and discard in the trash. Wash hands thoroughly with soap and water. 2. You may remove the patch earlier than 72 hours if you experience unpleasant side effects which may include dry mouth, dizziness or visual disturbances. 3. Avoid touching the patch. Wash your hands with soap and water after contact with the patch.

## 2019-07-21 NOTE — Op Note (Signed)
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  POSTOPERATIVE DIAGNOSIS: Same  PROCEDURE: Excision of abdominal scar contracture 4 x 15 cm skin and fat  SURGEON: Malcomb Gangemi [employer], DO  ASSISTANT: Phoebe Sharps, PA  EBL: 10 cc  CONDITION: Stable  COMPLICATIONS: None  INDICATION: The patient, Sarah Barrett, is a 79 y.o. 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   PROCEDURE DETAILS:  The patient was seen prior to surgery and marked.  The IV antibiotics were given. The patient was taken to the operating room and given a general anesthetic. A standard time out was performed and all information was confirmed by those in the room. SCDs were placed.  The abdomen was prepped and draped.  The local was placed at the marked incision lines.  The area was palpated and evaluated for potential release.  The lower abdominal incision was made with the #10 blade.  The bovie was used to obtain hemostasis and dissect to the abdominal fascia.  It was clear that the right sided abdominal scar was not going to be released with conservative measures. This was due to the previous surgeries and resulting scars.   Undermining at the abdominal fascia was carried to the level of the umbilicus.  The superior incision was then made with the #10 blade.  The bovie was used to excise the 4 x 15 cm are of skin and fat.  This showed marked improvement and release of the scar contracture.  A drain was placed and brought out the right side of the abdominal skin.  This was secured with the 4-0 silk.  The deep layers were then closed with the 3-0 Monocryl.  The remaining layers were closed with the 4-0 and 5-0 Monocryl.  There was good approximation of the skin without extreme tension.  Derma bond was applied and a sterile dressing.  The weight of the  tissue was 683 grams.  The patient was allowed to wake up and taken to recovery room in stable condition at the end of the case. The family was notified at the end of the case.   The advanced practice practitioner (APP) assisted throughout the case.  The APP was essential in retraction and counter traction when needed to make the case progress smoothly.  This retraction and assistance made it possible to see the tissue plans for the procedure.  The assistance was needed for blood control, tissue re-approximation and assisted with closure of the incision site.   The [place] was signed into law in 2016 which includes the topic of electronic health records.  This provides immediate access to information in MyChart.  This includes consultation notes, operative notes, office notes, lab results and pathology reports.  If you have any questions about what you read please let us know at your next visit or call us at the office.  We are right here with you.

## 2019-07-21 NOTE — Anesthesia Preprocedure Evaluation (Signed)
Anesthesia Evaluation  Patient identified by MRN, date of birth, ID band Patient awake    Reviewed: Allergy & Precautions, NPO status , Patient's Chart, lab work & pertinent test results  Airway Mallampati: II  TM Distance: >3 FB Neck ROM: Full    Dental no notable dental hx.    Pulmonary neg pulmonary ROS,    Pulmonary exam normal breath sounds clear to auscultation       Cardiovascular negative cardio ROS Normal cardiovascular exam Rhythm:Regular Rate:Normal     Neuro/Psych negative neurological ROS  negative psych ROS   GI/Hepatic negative GI ROS, Neg liver ROS,   Endo/Other  negative endocrine ROS  Renal/GU negative Renal ROS  negative genitourinary   Musculoskeletal negative musculoskeletal ROS (+)   Abdominal   Peds negative pediatric ROS (+)  Hematology negative hematology ROS (+)   Anesthesia Other Findings   Reproductive/Obstetrics negative OB ROS                             Anesthesia Physical Anesthesia Plan  ASA: II  Anesthesia Plan: General   Post-op Pain Management:    Induction: Intravenous  PONV Risk Score and Plan: 3 and Ondansetron, Dexamethasone and Treatment may vary due to age or medical condition  Airway Management Planned: Oral ETT  Additional Equipment:   Intra-op Plan:   Post-operative Plan: Extubation in OR  Informed Consent: I have reviewed the patients History and Physical, chart, labs and discussed the procedure including the risks, benefits and alternatives for the proposed anesthesia with the patient or authorized representative who has indicated his/her understanding and acceptance.     Dental advisory given  Plan Discussed with: CRNA and Surgeon  Anesthesia Plan Comments:         Anesthesia Quick Evaluation

## 2019-07-21 NOTE — Transfer of Care (Signed)
Immediate Anesthesia Transfer of Care Note  Patient: Sarah Barrett  Procedure(s) Performed: Excision and release of abdominal wall contracture (N/A Abdomen)  Patient Location: PACU  Anesthesia Type:General  Level of Consciousness: sedated  Airway & Oxygen Therapy: Patient Spontanous Breathing and Patient connected to face mask oxygen  Post-op Assessment: Report given to RN and Post -op Vital signs reviewed and stable  Post vital signs: Reviewed and stable  Last Vitals:  Vitals Value Taken Time  BP 116/54 07/21/19 0956  Temp    Pulse 90 07/21/19 0958  Resp 13 07/21/19 0958  SpO2 100 % 07/21/19 0958  Vitals shown include unvalidated device data.  Last Pain:  Vitals:   07/21/19 0733  TempSrc: Oral  PainSc: 0-No pain         Complications: No apparent anesthesia complications

## 2019-07-27 ENCOUNTER — Ambulatory Visit: Payer: Medicare HMO | Attending: Internal Medicine

## 2019-07-27 DIAGNOSIS — Z23 Encounter for immunization: Secondary | ICD-10-CM | POA: Insufficient documentation

## 2019-07-27 NOTE — Progress Notes (Signed)
   Covid-19 Vaccination Clinic  Name:  Sarah Barrett    MRN: [id number] DOB: [birth year]  07/27/2019  Sarah Barrett was observed post Covid-19 immunization for 15 minutes without incident. She was provided with Vaccine Information Sheet and instruction to access the V-Safe system.   Sarah Barrett was instructed to call 911 with any severe reactions post vaccine: Marland Kitchen Difficulty breathing  . Swelling of face and throat  . A fast heartbeat  . A bad rash all over body  . Dizziness and weakness   Immunizations Administered    Name Date Dose VIS Date Route   Pfizer COVID-19 Vaccine 07/27/2019  4:04 PM 0.3 mL 04/30/2019 Intramuscular   Manufacturer: [place]   Lot: [id number]   [place]: [id number]

## 2019-07-28 ENCOUNTER — Ambulatory Visit: Payer: Medicare HMO

## 2019-07-30 ENCOUNTER — Encounter: Payer: Self-pay | Admitting: Plastic Surgery

## 2019-07-30 ENCOUNTER — Other Ambulatory Visit: Payer: Self-pay

## 2019-07-30 ENCOUNTER — Ambulatory Visit (INDEPENDENT_AMBULATORY_CARE_PROVIDER_SITE_OTHER): Payer: Medicare HMO | Admitting: Plastic Surgery

## 2019-07-30 VITALS — BP 136/76 | HR 83 | Temp 98.3°F | Ht 59.0 in | Wt 137.0 lb

## 2019-07-30 DIAGNOSIS — L905 Scar conditions and fibrosis of skin: Secondary | ICD-10-CM

## 2019-07-30 NOTE — Progress Notes (Signed)
The patient is 79 year old female who underwent excision of contracture scars of her abdomen on 3/3.  The drain output is about 75 cc/day.  Serosanguineous in color.  The patient denies any pain.  Her bowels are moving and she is eating well.  There does not appear to be any hematoma or seroma. Her incisions are healing nicely.  We will leave the drain in until next Friday.  I have encouraged her to get a spanks.  She should continue with the abdominal binder until she gets one.  I think she will be more comfortable in a garment.

## 2019-08-05 NOTE — Progress Notes (Signed)
   Subjective:     Patient ID: Sarah Barrett, female    DOB: [birth year], 79 y.o.   MRN: [id number]  Chief Complaint  Patient presents with  . Skin Problem    scar of abdominal wall    HPI: The patient is a 79 y.o. female here for follow-up after undergoing excision of scar contractures on her abdomen on 07/21/2019 with Dr. Marla Roe. (~2 weeks Post-op)  Patient reports she is doing well overall.  Denies headache, fever, chest pain, shortness of breath, N/V.  Drain output continues to hold steady and has not decreased in the last week or so.  Incision is healing well, C/D/I.  Honeycomb bandage in place.  Patient is wearing abdominal binder 24/7.  Review of Systems  Constitutional: Negative for chills and fever.  HENT: Negative for congestion, sneezing and sore throat.   Respiratory: Negative for cough and shortness of breath.   Cardiovascular: Negative for chest pain.  Gastrointestinal: Negative for constipation, diarrhea, nausea and vomiting.  Skin: Negative for color change, pallor and rash.     Objective:   Vital Signs BP (!) 152/80 (BP Location: Left Arm, Patient Position: Sitting, Cuff Size: Normal)   Pulse 99   Temp 97.7 F (36.5 C) (Temporal)   Ht 4\' 11"  (1.499 m)   Wt 137 lb (62.1 kg)   SpO2 99%   BMI 27.67 kg/m  Vital Signs and Nursing Note Reviewed  Physical Exam  Constitutional: She is oriented to person, place, and time and well-developed, well-nourished, and in no distress.  HENT:  Head: Normocephalic and atraumatic.  Eyes: EOM are normal.  Cardiovascular: Normal rate.  Pulmonary/Chest: Effort normal.  Abdominal: Soft. She exhibits no distension.    Incision healing well, c/d/i. Dermabond present. No signs of infection, redness, drainage, seroma/hematoma.   Musculoskeletal:        General: Normal range of motion.     Cervical back: Normal range of motion.  Neurological: She is alert and oriented to person, place, and time. Gait normal.  Skin: Skin is warm  and dry. No rash noted. No erythema. No pallor.  Psychiatric: Mood, memory, affect and judgment normal.      Assessment/Plan:     ICD-10-CM   1. Scar of abdominal wall  L90.5     Overall Sarah Barrett is doing very well.  Her incision is healing nicely, C/D/I.  Honeycomb bandage was removed.  No signs of infection, redness, drainage, seroma/hematoma.  Drain output has remained steady over the last week.  Will not pull drain yet until output decreases.  She has an  appointment scheduled on Tuesday to check on her lower eyelids; will check drain output at that time to determine if drain is ready to be removed.   She may switch to wearing Spanks 24/7 (or continue wearing abdominal binder) until 6 weeks post-op. May shower, avoid direct water spray on incisions. Continue to avoid laying fully flat/extended.   Call office with any questions/concerns.   The [place] was signed into law in 2016 which includes the topic of electronic health records.  This provides immediate access to information in MyChart.  This includes consultation notes, operative notes, office notes, lab results and pathology reports.  If you have any questions about what you read please let us know at your next visit or call us at the office.  We are right here with you.   Threasa Heads, PA-C 08/06/2019, 9:17 AM

## 2019-08-06 ENCOUNTER — Ambulatory Visit (INDEPENDENT_AMBULATORY_CARE_PROVIDER_SITE_OTHER): Payer: Medicare HMO | Admitting: Plastic Surgery

## 2019-08-06 ENCOUNTER — Other Ambulatory Visit: Payer: Self-pay

## 2019-08-06 ENCOUNTER — Encounter: Payer: Self-pay | Admitting: Plastic Surgery

## 2019-08-06 VITALS — BP 152/80 | HR 99 | Temp 97.7°F | Ht 59.0 in | Wt 137.0 lb

## 2019-08-06 DIAGNOSIS — L905 Scar conditions and fibrosis of skin: Secondary | ICD-10-CM

## 2019-08-10 ENCOUNTER — Other Ambulatory Visit: Payer: Self-pay

## 2019-08-10 ENCOUNTER — Ambulatory Visit (INDEPENDENT_AMBULATORY_CARE_PROVIDER_SITE_OTHER): Payer: Medicare HMO | Admitting: Plastic Surgery

## 2019-08-10 ENCOUNTER — Encounter: Payer: Self-pay | Admitting: Plastic Surgery

## 2019-08-10 VITALS — BP 132/77 | HR 71 | Temp 96.4°F | Ht 59.0 in | Wt 137.0 lb

## 2019-08-10 DIAGNOSIS — H0266 Xanthelasma of left eye, unspecified eyelid: Secondary | ICD-10-CM

## 2019-08-10 DIAGNOSIS — H0263 Xanthelasma of right eye, unspecified eyelid: Secondary | ICD-10-CM

## 2019-08-10 NOTE — Progress Notes (Signed)
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 Consent was confirmed and signed. Time out was called and all information was confirmed to be correct. The area  area was prepped with alcohol and wiped dry. The FRAX laser was utilized. The lower eyelids were lasered. The patient tolerated the procedure well and there were no complications. The patient is to follow up in 4 weeks.

## 2019-08-12 ENCOUNTER — Telehealth: Payer: Self-pay | Admitting: Plastic Surgery

## 2019-08-12 NOTE — Telephone Encounter (Signed)
Spoke with patient. Currently she is draining > 20 cc per 12 hours.  This is higher than we would want to pull the drain (< 40 cc per 24 hrs). She will plan to come in on Tuesday (30th) at 10:00am to see me. (please update her appointment)  Thanks.

## 2019-08-12 NOTE — Telephone Encounter (Signed)
Pt called to advise that she is supposed to have her drainage tube removed tomorrow but it's still at 20 and Venetia Night wanted it at 65. It's been 20 for the last 2-3 days. Please call to advise

## 2019-08-13 ENCOUNTER — Ambulatory Visit: Payer: Medicare HMO | Admitting: Plastic Surgery

## 2019-08-16 ENCOUNTER — Telehealth: Payer: Self-pay

## 2019-08-16 NOTE — Telephone Encounter (Signed)
Ms. Sweazy called to let us know that since 9pm yesterday to 9am today, her drain output was 20 cc and from 9 am to 3pm today, it was 12 cc. I spoke with Phoebe Sharps, PA-C who recommended rescheduling until Thursday 08/19/2019

## 2019-08-17 ENCOUNTER — Ambulatory Visit: Payer: Medicare HMO | Admitting: Plastic Surgery

## 2019-08-18 NOTE — Progress Notes (Deleted)
Patient is an 79 year old female here for follow-up after undergoing excision of scar contracture on her abdomen on 07/21/2019 with Dr. Marla Roe. (~ 4 weeks PO)  Drain removal

## 2019-08-19 ENCOUNTER — Ambulatory Visit: Payer: Medicare HMO | Admitting: Plastic Surgery

## 2019-08-24 ENCOUNTER — Other Ambulatory Visit: Payer: Self-pay

## 2019-08-24 ENCOUNTER — Encounter: Payer: Self-pay | Admitting: Surgical

## 2019-08-24 ENCOUNTER — Ambulatory Visit (INDEPENDENT_AMBULATORY_CARE_PROVIDER_SITE_OTHER): Payer: Medicare HMO | Admitting: Surgical

## 2019-08-24 VITALS — BP 156/79 | HR 97 | Temp 97.5°F | Ht 59.0 in | Wt 137.0 lb

## 2019-08-24 DIAGNOSIS — L905 Scar conditions and fibrosis of skin: Secondary | ICD-10-CM

## 2019-08-24 NOTE — Progress Notes (Signed)
Patient is an 79 year old female here for follow-up after undergoing excision of scar contractures on her abdomen on 07/21/2019 with Dr. Marla Roe.  She is 5 weeks postop.  She is doing well overall.  Her incision has healed nicely.  She does not have any fevers, chills, nausea, vomiting.  Right JP drain output has continued to be higher than expected, but patient reports that she has been more active lately.  Drain has been in for approximately 5 weeks now, despite the output think it would be best to remove drain at this time.  I previously discussed this with Dr. Marla Roe who is in agreement with this plan.  I recommended to the patient that she wear spanks and abdominal binder for minimum of 1 week after today. She can then transition into wearing spanks 24/7.  She would like to go back to working out with a [employer] group and I think this would be fine, but I recommend she wears an abdominal binder during exercise to decrease possibility of a seroma formation.  We discussed the risk versus benefit of removing the JP drain versus keeping the JP drain in place.  She is aware that we may need to aspirate the area if she develops any fluid accumulation or notices any swelling.  The patient is doing well, I do not see any sign of fluid accumulation within her abdomen.  Her incision is healing nicely.  There is no sign of any infection, hematoma.  She is voiding and moving bowels normally. Recommend calling with any questions or concerns. Follow-up in 1 month.

## 2019-08-31 DIAGNOSIS — E785 Hyperlipidemia, unspecified: Secondary | ICD-10-CM | POA: Diagnosis not present

## 2019-08-31 DIAGNOSIS — I10 Essential (primary) hypertension: Secondary | ICD-10-CM | POA: Diagnosis not present

## 2019-09-14 ENCOUNTER — Encounter: Payer: Self-pay | Admitting: Plastic Surgery

## 2019-09-14 ENCOUNTER — Ambulatory Visit (INDEPENDENT_AMBULATORY_CARE_PROVIDER_SITE_OTHER): Payer: Medicare HMO | Admitting: Plastic Surgery

## 2019-09-14 ENCOUNTER — Other Ambulatory Visit: Payer: Self-pay

## 2019-09-14 VITALS — BP 156/87 | HR 78 | Temp 97.8°F | Ht 59.0 in | Wt 137.0 lb

## 2019-09-14 DIAGNOSIS — H0263 Xanthelasma of right eye, unspecified eyelid: Secondary | ICD-10-CM

## 2019-09-14 DIAGNOSIS — H0266 Xanthelasma of left eye, unspecified eyelid: Secondary | ICD-10-CM

## 2019-09-14 NOTE — Progress Notes (Signed)
Preoperative Dx: Xanthelasma of eyelids  Postoperative Dx:  same  Procedure: laser to lower lids   Anesthesia: none  Description of Procedure:  Risks and complications were explained to the patient. Consent was confirmed and signed. Time out was called and all information was confirmed to be correct. The area  area was prepped with alcohol and wiped dry. The Frax laser was set and used to laser the lower lids. The patient tolerated the procedure well and there were no complications. The patient is to follow up in 4 - 6 weeks.  I also used the NdYag for several small hemangiomas.

## 2019-09-24 ENCOUNTER — Encounter: Payer: Self-pay | Admitting: Surgical

## 2019-09-24 ENCOUNTER — Ambulatory Visit (INDEPENDENT_AMBULATORY_CARE_PROVIDER_SITE_OTHER): Payer: Medicare HMO | Admitting: Surgical

## 2019-09-24 DIAGNOSIS — L905 Scar conditions and fibrosis of skin: Secondary | ICD-10-CM

## 2019-09-24 NOTE — Progress Notes (Signed)
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 Marla Roe.  She is doing really well today.  She has no complaints.  No F/C/N/V.  We reviewed preop photos today, patient is very pleased with her outcome.   Chaperone present on exam On exam panniculectomy incision is well-healed, little bit of tenderness at the periincisional area, no erythema.  No drainage noted.  No wounds noted.  No foul odor.  Patient is doing well, no sign of any infection, seroma, hematoma.  Her incisions are healing really nice. Pictures were obtained of the patient and placed in the chart with the patient's or guardian's permission. Follow-up in 6 months.

## 2019-10-28 DIAGNOSIS — N6012 Diffuse cystic mastopathy of left breast: Secondary | ICD-10-CM | POA: Diagnosis not present

## 2019-10-28 DIAGNOSIS — R928 Other abnormal and inconclusive findings on diagnostic imaging of breast: Secondary | ICD-10-CM | POA: Diagnosis not present

## 2019-11-19 ENCOUNTER — Other Ambulatory Visit: Payer: Self-pay

## 2019-11-19 ENCOUNTER — Encounter: Payer: Self-pay | Admitting: Plastic Surgery

## 2019-11-19 ENCOUNTER — Ambulatory Visit (INDEPENDENT_AMBULATORY_CARE_PROVIDER_SITE_OTHER): Payer: Medicare HMO | Admitting: Plastic Surgery

## 2019-11-19 VITALS — BP 153/74 | HR 72 | Temp 97.7°F

## 2019-11-19 DIAGNOSIS — H0266 Xanthelasma of left eye, unspecified eyelid: Secondary | ICD-10-CM

## 2019-11-19 DIAGNOSIS — H0263 Xanthelasma of right eye, unspecified eyelid: Secondary | ICD-10-CM

## 2019-11-19 NOTE — Progress Notes (Signed)
Preoperative Dx: Xanthelasma lower lids  Postoperative Dx:  same  Procedure: laser to lower lids  Anesthesia: none  Description of Procedure:  Risks and complications were explained to the patient. Consent was confirmed and signed. Time out was called and all information was confirmed to be correct. The area  area was prepped with alcohol and wiped dry. The FRAX laser was set 25. The lower lids were lasered. The patient tolerated the procedure well and there were no complications. The patient is to follow up in 4 weeks.

## 2019-12-20 DIAGNOSIS — R030 Elevated blood-pressure reading, without diagnosis of hypertension: Secondary | ICD-10-CM | POA: Diagnosis not present

## 2019-12-20 DIAGNOSIS — E785 Hyperlipidemia, unspecified: Secondary | ICD-10-CM | POA: Diagnosis not present

## 2019-12-22 DIAGNOSIS — Z961 Presence of intraocular lens: Secondary | ICD-10-CM | POA: Diagnosis not present

## 2019-12-22 DIAGNOSIS — H26493 Other secondary cataract, bilateral: Secondary | ICD-10-CM | POA: Diagnosis not present

## 2019-12-22 DIAGNOSIS — H35371 Puckering of macula, right eye: Secondary | ICD-10-CM | POA: Diagnosis not present

## 2019-12-22 DIAGNOSIS — H04123 Dry eye syndrome of bilateral lacrimal glands: Secondary | ICD-10-CM | POA: Diagnosis not present

## 2020-01-18 ENCOUNTER — Other Ambulatory Visit: Payer: Self-pay

## 2020-01-18 ENCOUNTER — Ambulatory Visit (INDEPENDENT_AMBULATORY_CARE_PROVIDER_SITE_OTHER): Payer: Self-pay | Admitting: Plastic Surgery

## 2020-01-18 ENCOUNTER — Encounter: Payer: Self-pay | Admitting: Plastic Surgery

## 2020-01-18 VITALS — BP 117/64 | HR 74 | Temp 97.1°F

## 2020-01-18 DIAGNOSIS — H0266 Xanthelasma of left eye, unspecified eyelid: Secondary | ICD-10-CM

## 2020-01-18 DIAGNOSIS — Z719 Counseling, unspecified: Secondary | ICD-10-CM | POA: Insufficient documentation

## 2020-01-18 DIAGNOSIS — H0263 Xanthelasma of right eye, unspecified eyelid: Secondary | ICD-10-CM

## 2020-01-18 NOTE — Progress Notes (Signed)
Preoperative Dx: Hyperpigmentation of face  Postoperative Dx:  same  Procedure: laser to bilateral cheeks  Anesthesia: none  Description of Procedure:  Risks and complications were explained to the patient. Consent was confirmed and signed. Time out was called and all information was confirmed to be correct. The area  area was prepped with alcohol and wiped dry. The IPL laser was set at 5.8 J/cm2. The cheeks were lasered. The patient tolerated the procedure well and there were no complications. The patient is to follow up in 4 weeks.

## 2020-01-26 DIAGNOSIS — N63 Unspecified lump in unspecified breast: Secondary | ICD-10-CM | POA: Diagnosis not present

## 2020-01-26 DIAGNOSIS — N61 Mastitis without abscess: Secondary | ICD-10-CM | POA: Diagnosis not present

## 2020-02-01 DIAGNOSIS — N6002 Solitary cyst of left breast: Secondary | ICD-10-CM | POA: Diagnosis not present

## 2020-02-01 DIAGNOSIS — R928 Other abnormal and inconclusive findings on diagnostic imaging of breast: Secondary | ICD-10-CM | POA: Diagnosis not present

## 2020-02-21 ENCOUNTER — Other Ambulatory Visit: Payer: Self-pay

## 2020-02-21 DIAGNOSIS — N6321 Unspecified lump in the left breast, upper outer quadrant: Secondary | ICD-10-CM | POA: Diagnosis not present

## 2020-02-21 DIAGNOSIS — C50412 Malignant neoplasm of upper-outer quadrant of left female breast: Secondary | ICD-10-CM | POA: Diagnosis not present

## 2020-02-21 DIAGNOSIS — Z170 Estrogen receptor positive status [ER+]: Secondary | ICD-10-CM | POA: Diagnosis not present

## 2020-02-23 ENCOUNTER — Encounter: Payer: Self-pay | Admitting: *Deleted

## 2020-03-03 DIAGNOSIS — C50412 Malignant neoplasm of upper-outer quadrant of left female breast: Secondary | ICD-10-CM | POA: Diagnosis not present

## 2020-03-07 ENCOUNTER — Other Ambulatory Visit: Payer: Medicare HMO | Admitting: Plastic Surgery

## 2020-03-13 ENCOUNTER — Other Ambulatory Visit (HOSPITAL_COMMUNITY)
Admission: RE | Admit: 2020-03-13 | Discharge: 2020-03-13 | Disposition: A | Discharge status: Home / Self Care | Payer: Medicare HMO | Source: Ambulatory Visit | Attending: General Surgery | Admitting: General Surgery

## 2020-03-13 DIAGNOSIS — Z20822 Contact with and (suspected) exposure to covid-19: Secondary | ICD-10-CM | POA: Insufficient documentation

## 2020-03-13 DIAGNOSIS — Z01812 Encounter for preprocedural laboratory examination: Secondary | ICD-10-CM | POA: Diagnosis not present

## 2020-03-13 LAB — SARS CORONAVIRUS 2 (TAT 6-24 HRS): SARS Coronavirus 2: NEGATIVE

## 2020-03-14 ENCOUNTER — Other Ambulatory Visit: Payer: Self-pay | Admitting: General Surgery

## 2020-03-15 ENCOUNTER — Other Ambulatory Visit: Payer: Self-pay

## 2020-03-15 ENCOUNTER — Encounter (HOSPITAL_COMMUNITY): Payer: Self-pay | Admitting: General Surgery

## 2020-03-15 NOTE — Progress Notes (Signed)
Patient denies shortness of breath, fever, cough or chest pain.  PCP - Dr Kelton Pillar Cardiologist - n/a  Chest x-ray - n/a EKG - n/a Stress Test - n/a ECHO - n/a Cardiac Cath - n/a  ERAS: Clears til 4:30 am DOS, no drink.  STOP now taking any Aspirin (unless otherwise instructed by your surgeon), Aleve, Naproxen, Ibuprofen, Motrin, Advil, Goody's, BC's, all herbal medications, fish oil, and all vitamins.   Coronavirus Screening Covid test on 03/13/20 was negative.  Patient verbalized understanding of instructions that were given via phone.

## 2020-03-15 NOTE — Anesthesia Preprocedure Evaluation (Addendum)
Anesthesia Evaluation  Patient identified by MRN, date of birth, ID band Patient awake    Airway Mallampati: II  TM Distance: >3 FB Neck ROM: Full    Dental  (+) Partial Upper   Pulmonary neg pulmonary ROS,    Pulmonary exam normal        Cardiovascular negative cardio ROS   Rhythm:Regular Rate:Normal     Neuro/Psych negative neurological ROS  negative psych ROS   GI/Hepatic negative GI ROS, Neg liver ROS,   Endo/Other  negative endocrine ROS  Renal/GU negative Renal ROS  negative genitourinary   Musculoskeletal  (+) Arthritis , Osteoarthritis,  Bilateral breast cancer here for left mastectomy   Abdominal (+)  Abdomen: soft. Bowel sounds: normal.  Peds  Hematology negative hematology ROS (+)   Anesthesia Other Findings   Reproductive/Obstetrics                            Anesthesia Physical Anesthesia Plan  ASA: III  Anesthesia Plan: General and Regional   Post-op Pain Management:  Regional for Post-op pain   Induction: Intravenous  PONV Risk Score and Plan: 3 and Ondansetron, Dexamethasone, Midazolam and Treatment may vary due to age or medical condition  Airway Management Planned: Mask and LMA  Additional Equipment: None  Intra-op Plan:   Post-operative Plan: Extubation in OR  Informed Consent: I have reviewed the patients History and Physical, chart, labs and discussed the procedure including the risks, benefits and alternatives for the proposed anesthesia with the patient or authorized representative who has indicated his/her understanding and acceptance.     Dental advisory given  Plan Discussed with: CRNA  Anesthesia Plan Comments: (Lab Results      Component                Value               Date                      WBC                      4.9                 03/16/2020                HGB                      14.5                03/16/2020                HCT                       44.8                03/16/2020                MCV                      93.3                03/16/2020                PLT                      188  03/16/2020           Lab Results      Component                Value               Date                      NA                       139                 02/04/2019                K                        4.0                 02/04/2019                CO2                      26                  10/12/2008                GLUCOSE                  90                  02/04/2019                BUN                      15                  02/04/2019                CREATININE               0.60                02/04/2019                CALCIUM                  8.9                 10/12/2008          )       Anesthesia Quick Evaluation

## 2020-03-16 ENCOUNTER — Other Ambulatory Visit: Payer: Self-pay

## 2020-03-16 ENCOUNTER — Observation Stay (HOSPITAL_COMMUNITY)
Admission: RE | Admit: 2020-03-16 | Discharge: 2020-03-17 | Disposition: A | Discharge status: Home / Self Care | Payer: Medicare HMO | Attending: General Surgery | Admitting: General Surgery

## 2020-03-16 ENCOUNTER — Encounter (HOSPITAL_COMMUNITY): Payer: Self-pay | Admitting: General Surgery

## 2020-03-16 ENCOUNTER — Ambulatory Visit (HOSPITAL_COMMUNITY): Payer: Medicare HMO | Admitting: Anesthesiology

## 2020-03-16 ENCOUNTER — Encounter (HOSPITAL_COMMUNITY)
Admission: RE | Disposition: A | Discharge status: Home / Self Care | Payer: Self-pay | Source: Home / Self Care | Attending: General Surgery

## 2020-03-16 DIAGNOSIS — N6032 Fibrosclerosis of left breast: Secondary | ICD-10-CM | POA: Diagnosis not present

## 2020-03-16 DIAGNOSIS — C50412 Malignant neoplasm of upper-outer quadrant of left female breast: Secondary | ICD-10-CM | POA: Diagnosis not present

## 2020-03-16 DIAGNOSIS — Z170 Estrogen receptor positive status [ER+]: Secondary | ICD-10-CM | POA: Diagnosis not present

## 2020-03-16 DIAGNOSIS — Z9012 Acquired absence of left breast and nipple: Secondary | ICD-10-CM

## 2020-03-16 DIAGNOSIS — Z853 Personal history of malignant neoplasm of breast: Secondary | ICD-10-CM | POA: Insufficient documentation

## 2020-03-16 DIAGNOSIS — C50912 Malignant neoplasm of unspecified site of left female breast: Secondary | ICD-10-CM | POA: Diagnosis not present

## 2020-03-16 DIAGNOSIS — Z79899 Other long term (current) drug therapy: Secondary | ICD-10-CM | POA: Insufficient documentation

## 2020-03-16 DIAGNOSIS — G8918 Other acute postprocedural pain: Secondary | ICD-10-CM | POA: Diagnosis not present

## 2020-03-16 DIAGNOSIS — N641 Fat necrosis of breast: Secondary | ICD-10-CM | POA: Diagnosis not present

## 2020-03-16 DIAGNOSIS — Z803 Family history of malignant neoplasm of breast: Secondary | ICD-10-CM | POA: Insufficient documentation

## 2020-03-16 HISTORY — PX: SIMPLE MASTECTOMY WITH AXILLARY SENTINEL NODE BIOPSY: SHX6098

## 2020-03-16 LAB — CBC
HCT: 44.8 % (ref 36.0–46.0)
Hemoglobin: 14.5 g/dL (ref 12.0–15.0)
MCH: 30.2 pg (ref 26.0–34.0)
MCHC: 32.4 g/dL (ref 30.0–36.0)
MCV: 93.3 fL (ref 80.0–100.0)
Platelets: 188 10*3/uL (ref 150–400)
RBC: 4.8 MIL/uL (ref 3.87–5.11)
RDW: 14.3 % (ref 11.5–15.5)
WBC: 4.9 10*3/uL (ref 4.0–10.5)
nRBC: 0 % (ref 0.0–0.2)

## 2020-03-16 SURGERY — SIMPLE MASTECTOMY
Anesthesia: Regional | Site: Breast | Laterality: Left

## 2020-03-16 MED ORDER — LACTATED RINGERS IV SOLN: INTRAVENOUS | Status: DC

## 2020-03-16 MED ORDER — CEFAZOLIN SODIUM-DEXTROSE 2-4 GM/100ML-% IV SOLN
2.0000 g | INTRAVENOUS | Status: AC
  Administered 2020-03-16: 2 g via INTRAVENOUS
  Filled 2020-03-16: qty 100

## 2020-03-16 MED ORDER — ACETAMINOPHEN 500 MG PO TABS
1000.0000 mg | ORAL_TABLET | Freq: Four times a day (QID) | ORAL | Status: DC
  Administered 2020-03-16 (×2): 1000 mg via ORAL
  Filled 2020-03-16 (×3): qty 2

## 2020-03-16 MED ORDER — KETOROLAC TROMETHAMINE 30 MG/ML IJ SOLN
INTRAMUSCULAR | Status: AC
  Filled 2020-03-16: qty 1

## 2020-03-16 MED ORDER — PHENYLEPHRINE HCL-NACL 10-0.9 MG/250ML-% IV SOLN: INTRAVENOUS | Status: DC | PRN

## 2020-03-16 MED ORDER — DEXAMETHASONE SODIUM PHOSPHATE 10 MG/ML IJ SOLN
INTRAMUSCULAR | Status: AC
  Filled 2020-03-16: qty 1

## 2020-03-16 MED ORDER — ROPIVACAINE HCL 5 MG/ML IJ SOLN
INTRAMUSCULAR | Status: DC | PRN
  Administered 2020-03-16: 25 mL via PERINEURAL

## 2020-03-16 MED ORDER — METHOCARBAMOL 500 MG PO TABS
500.0000 mg | ORAL_TABLET | Freq: Four times a day (QID) | ORAL | Status: DC | PRN
  Administered 2020-03-16: 500 mg via ORAL
  Filled 2020-03-16: qty 1

## 2020-03-16 MED ORDER — FENTANYL CITRATE (PF) 100 MCG/2ML IJ SOLN
25.0000 ug | INTRAMUSCULAR | Status: DC | PRN
  Administered 2020-03-16 (×5): 25 ug via INTRAVENOUS

## 2020-03-16 MED ORDER — PROPOFOL 10 MG/ML IV BOLUS
INTRAVENOUS | Status: DC | PRN
  Administered 2020-03-16: 130 mg via INTRAVENOUS

## 2020-03-16 MED ORDER — EPHEDRINE SULFATE-NACL 50-0.9 MG/10ML-% IV SOSY
PREFILLED_SYRINGE | INTRAVENOUS | Status: DC | PRN
  Administered 2020-03-16: 10 mg via INTRAVENOUS

## 2020-03-16 MED ORDER — METHOCARBAMOL 500 MG PO TABS
ORAL_TABLET | ORAL | Status: AC
  Filled 2020-03-16: qty 1

## 2020-03-16 MED ORDER — ONDANSETRON HCL 4 MG/2ML IJ SOLN: 4.0000 mg | Freq: Once | INTRAMUSCULAR | Status: DC | PRN

## 2020-03-16 MED ORDER — FENTANYL CITRATE (PF) 100 MCG/2ML IJ SOLN
INTRAMUSCULAR | Status: AC
  Filled 2020-03-16: qty 2

## 2020-03-16 MED ORDER — 0.9 % SODIUM CHLORIDE (POUR BTL) OPTIME
TOPICAL | Status: DC | PRN
  Administered 2020-03-16: 1000 mL

## 2020-03-16 MED ORDER — OXYCODONE HCL 5 MG PO TABS
ORAL_TABLET | ORAL | Status: AC
  Filled 2020-03-16: qty 1

## 2020-03-16 MED ORDER — ACETAMINOPHEN 500 MG PO TABS
1000.0000 mg | ORAL_TABLET | ORAL | Status: AC
  Administered 2020-03-16: 1000 mg via ORAL
  Filled 2020-03-16: qty 2

## 2020-03-16 MED ORDER — MIDAZOLAM HCL 2 MG/2ML IJ SOLN
INTRAMUSCULAR | Status: DC | PRN
  Administered 2020-03-16: 1 mg via INTRAVENOUS

## 2020-03-16 MED ORDER — ORAL CARE MOUTH RINSE: 15.0000 mL | Freq: Once | OROMUCOSAL | Status: AC

## 2020-03-16 MED ORDER — PROPOFOL 10 MG/ML IV BOLUS
INTRAVENOUS | Status: AC
  Filled 2020-03-16: qty 40

## 2020-03-16 MED ORDER — SODIUM CHLORIDE 0.9 % IV SOLN: INTRAVENOUS | Status: DC

## 2020-03-16 MED ORDER — FENTANYL CITRATE (PF) 250 MCG/5ML IJ SOLN
INTRAMUSCULAR | Status: DC | PRN
  Administered 2020-03-16: 50 ug via INTRAVENOUS

## 2020-03-16 MED ORDER — MORPHINE SULFATE (PF) 2 MG/ML IV SOLN
1.0000 mg | INTRAVENOUS | Status: DC | PRN
  Filled 2020-03-16: qty 1

## 2020-03-16 MED ORDER — CHLORHEXIDINE GLUCONATE 0.12 % MT SOLN
15.0000 mL | Freq: Once | OROMUCOSAL | Status: AC
  Administered 2020-03-16: 15 mL via OROMUCOSAL
  Filled 2020-03-16: qty 15

## 2020-03-16 MED ORDER — ENSURE PRE-SURGERY PO LIQD: 296.0000 mL | Freq: Once | ORAL | Status: DC

## 2020-03-16 MED ORDER — ONDANSETRON HCL 4 MG/2ML IJ SOLN
INTRAMUSCULAR | Status: DC | PRN
  Administered 2020-03-16: 4 mg via INTRAVENOUS

## 2020-03-16 MED ORDER — ONDANSETRON 4 MG PO TBDP: 4.0000 mg | ORAL_TABLET | Freq: Four times a day (QID) | ORAL | Status: DC | PRN

## 2020-03-16 MED ORDER — ONDANSETRON HCL 4 MG/2ML IJ SOLN
INTRAMUSCULAR | Status: AC
  Filled 2020-03-16: qty 2

## 2020-03-16 MED ORDER — FENTANYL CITRATE (PF) 250 MCG/5ML IJ SOLN
INTRAMUSCULAR | Status: AC
  Filled 2020-03-16: qty 5

## 2020-03-16 MED ORDER — ONDANSETRON HCL 4 MG/2ML IJ SOLN: 4.0000 mg | Freq: Four times a day (QID) | INTRAMUSCULAR | Status: DC | PRN

## 2020-03-16 MED ORDER — PHENYLEPHRINE 40 MCG/ML (10ML) SYRINGE FOR IV PUSH (FOR BLOOD PRESSURE SUPPORT)
PREFILLED_SYRINGE | INTRAVENOUS | Status: DC | PRN
  Administered 2020-03-16: 120 ug via INTRAVENOUS
  Administered 2020-03-16: 80 ug via INTRAVENOUS
  Administered 2020-03-16 (×2): 120 ug via INTRAVENOUS

## 2020-03-16 MED ORDER — OXYCODONE HCL 5 MG PO TABS
5.0000 mg | ORAL_TABLET | ORAL | Status: DC | PRN
  Administered 2020-03-16: 10 mg via ORAL
  Administered 2020-03-16: 5 mg via ORAL
  Filled 2020-03-16 (×2): qty 2

## 2020-03-16 MED ORDER — KETOROLAC TROMETHAMINE 30 MG/ML IJ SOLN
30.0000 mg | Freq: Once | INTRAMUSCULAR | Status: AC | PRN
  Administered 2020-03-16: 30 mg via INTRAVENOUS

## 2020-03-16 MED ORDER — MIDAZOLAM HCL 2 MG/2ML IJ SOLN
INTRAMUSCULAR | Status: AC
  Filled 2020-03-16: qty 2

## 2020-03-16 MED ORDER — DEXAMETHASONE SODIUM PHOSPHATE 10 MG/ML IJ SOLN
INTRAMUSCULAR | Status: DC | PRN
  Administered 2020-03-16: 5 mg via INTRAVENOUS

## 2020-03-16 MED ORDER — LIDOCAINE 2% (20 MG/ML) 5 ML SYRINGE
INTRAMUSCULAR | Status: AC
  Filled 2020-03-16: qty 5

## 2020-03-16 MED ORDER — LIDOCAINE 2% (20 MG/ML) 5 ML SYRINGE
INTRAMUSCULAR | Status: DC | PRN
  Administered 2020-03-16: 60 mg via INTRAVENOUS

## 2020-03-16 SURGICAL SUPPLY — 34 items
APPLIER CLIP 9.375 MED OPEN (MISCELLANEOUS) ×3
BINDER BREAST LRG (GAUZE/BANDAGES/DRESSINGS) ×3 IMPLANT
BINDER BREAST XLRG (GAUZE/BANDAGES/DRESSINGS) IMPLANT
CHLORAPREP W/TINT 26 (MISCELLANEOUS) ×3 IMPLANT
CLIP APPLIE 9.375 MED OPEN (MISCELLANEOUS) ×1 IMPLANT
CLOSURE WOUND 1/2 X4 (GAUZE/BANDAGES/DRESSINGS) ×2
COVER SURGICAL LIGHT HANDLE (MISCELLANEOUS) ×3 IMPLANT
COVER WAND RF STERILE (DRAPES) IMPLANT
DERMABOND ADVANCED (GAUZE/BANDAGES/DRESSINGS) ×2
DERMABOND ADVANCED .7 DNX12 (GAUZE/BANDAGES/DRESSINGS) ×1 IMPLANT
DRAIN CHANNEL 19F RND (DRAIN) ×3 IMPLANT
DRAPE LAPAROSCOPIC ABDOMINAL (DRAPES) ×3 IMPLANT
ELECT REM PT RETURN 9FT ADLT (ELECTROSURGICAL) ×3
ELECTRODE REM PT RTRN 9FT ADLT (ELECTROSURGICAL) ×1 IMPLANT
EVACUATOR SILICONE 100CC (DRAIN) ×3 IMPLANT
GAUZE SPONGE 4X4 12PLY STRL (GAUZE/BANDAGES/DRESSINGS) ×3 IMPLANT
GLOVE BIO SURGEON STRL SZ7 (GLOVE) ×3 IMPLANT
GLOVE BIOGEL PI IND STRL 7.5 (GLOVE) ×1 IMPLANT
GLOVE BIOGEL PI INDICATOR 7.5 (GLOVE) ×2
GOWN STRL REUS W/ TWL LRG LVL3 (GOWN DISPOSABLE) ×3 IMPLANT
GOWN STRL REUS W/TWL LRG LVL3 (GOWN DISPOSABLE) ×9
KIT BASIN OR (CUSTOM PROCEDURE TRAY) ×3 IMPLANT
KIT TURNOVER KIT B (KITS) ×3 IMPLANT
NS IRRIG 1000ML POUR BTL (IV SOLUTION) ×3 IMPLANT
PACK GENERAL/GYN (CUSTOM PROCEDURE TRAY) ×3 IMPLANT
PAD ARMBOARD 7.5X6 YLW CONV (MISCELLANEOUS) ×3 IMPLANT
PENCIL SMOKE EVACUATOR (MISCELLANEOUS) ×3 IMPLANT
STRIP CLOSURE SKIN 1/2X4 (GAUZE/BANDAGES/DRESSINGS) ×4 IMPLANT
SUT ETHILON 2 0 FS 18 (SUTURE) ×3 IMPLANT
SUT MON AB 4-0 PC3 18 (SUTURE) ×3 IMPLANT
SUT VIC AB 2-0 SH 18 (SUTURE) ×3 IMPLANT
SUT VIC AB 3-0 SH 8-18 (SUTURE) ×6 IMPLANT
TOWEL GREEN STERILE (TOWEL DISPOSABLE) ×3 IMPLANT
TOWEL GREEN STERILE FF (TOWEL DISPOSABLE) ×3 IMPLANT

## 2020-03-16 NOTE — Anesthesia Postprocedure Evaluation (Signed)
Anesthesia Post Note  Patient: Sarah Barrett  Procedure(s) Performed: LEFT MASTECTOMY (Left Breast)     Patient location during evaluation: PACU Anesthesia Type: Regional and General Level of consciousness: awake and alert Pain management: pain level controlled Vital Signs Assessment: post-procedure vital signs reviewed and stable Respiratory status: spontaneous breathing, nonlabored ventilation, respiratory function stable and patient connected to nasal cannula oxygen Cardiovascular status: blood pressure returned to baseline and stable Postop Assessment: no apparent nausea or vomiting Anesthetic complications: no   No complications documented.  Last Vitals:  Vitals:   03/16/20 1042 03/16/20 1128  BP: (!) 141/76 (!) 145/70  Pulse: 69 72  Resp: 10 18  Temp: (!) 36.4 C   SpO2: 97% 99%    Last Pain:  Vitals:   03/16/20 1015  TempSrc:   PainSc: Asleep                 March Rummage Mathea Frieling

## 2020-03-16 NOTE — H&P (Signed)
79 yof referred for new left breast cancer. she has history of a right breast cancer in 1999 treated with lumpectomy/sn/radiotherapy/antiestrogens. she also has a history of left breast cancer treated in 2000 with same treatment. never had chemo. In June she had screening mm that showed a benign cluster of cysts in luoq with [place]. she was planned for 12 month follow up. she then developed some left nipple inversion and erythema. this was treated with abx and did resolve. she states it has not returned. she then had repeat mm/us. her density is b. she had a new 2.3 cm area of multiple small masses in luoq. there was a second 2 cm area as well. the most anterior mass is 1.7x1.1x1.4 cm in size and posteriorly there is a 7x5x4 mm mass. there are no abnormal nodes in her axilla. 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 she is here with her husband (daughter is on phone) to discuss options she knows Dr Adela Lank, retired rad onc   Past Surgical History Darden Palmer, [place]; 03/03/2020 10:13 AM) Appendectomy  Breast Biopsy  Bilateral. Breast Mass; Local Excision  Bilateral. Cataract Surgery  Bilateral, Right. Foot Surgery  Left. Knee Surgery  Right. Oral Surgery   Diagnostic Studies History Darden Palmer, [place]; 03/03/2020 10:13 AM) Colonoscopy  1-5 years ago Mammogram  within last year Pap Smear  1-5 years ago  Allergies Darden Palmer, RMA; 03/03/2020 10:14 AM) Morphine Sulfate *ANALGESICS - OPIOID*  Allergies Reconciled   Medication History Darden Palmer, RMA; 03/03/2020 10:14 AM) Calcium (500MG Tablet, Oral) Active. Flaxseed Oil (1000MG Capsule, Oral) Active. Multi-Day (Oral) Active. Medications Reconciled  Social History Darden Palmer, [place]; 03/03/2020 10:13 AM) No alcohol use  No caffeine use  No drug use  Tobacco use  Never smoker.  Family History Darden Palmer, [place]; 03/03/2020  10:13 AM) Breast Cancer  Family Members In General, Mother, Sister.  Pregnancy / Birth History Darden Palmer, [place]; 03/03/2020 10:13 AM) Age at menarche  36 years. Age of menopause  <45 Gravida  87 Maternal age  26-30 Para  3  Other Problems Darden Palmer, [place]; 03/03/2020 10:13 AM) Back Pain  Breast Cancer  Hemorrhoids  Lump In Breast     Review of Systems Lattie Haw [place] RMA; 03/03/2020 10:13 AM) General Not Present- Appetite Loss, Chills, Fatigue, Fever, Night Sweats, Weight Gain and Weight Loss. Skin Not Present- Change in Wart/Mole, Dryness, Hives, Jaundice, New Lesions, Non-Healing Wounds, Rash and Ulcer. HEENT Present- Hearing Loss and Wears glasses/contact lenses. Not Present- Earache, Hoarseness, Nose Bleed, Oral Ulcers, Ringing in the Ears, Seasonal Allergies, Sinus Pain, Sore Throat, Visual Disturbances and Yellow Eyes. Respiratory Not Present- Bloody sputum, Chronic Cough, Difficulty Breathing, Snoring and Wheezing. Breast Present- Breast Mass and Breast Pain. Not Present- Nipple Discharge and Skin Changes. Cardiovascular Not Present- Chest Pain, Difficulty Breathing Lying Down, Leg Cramps, Palpitations, Rapid Heart Rate, Shortness of Breath and Swelling of Extremities. Gastrointestinal Not Present- Abdominal Pain, Bloating, Bloody Stool, Change in Bowel Habits, Chronic diarrhea, Constipation, Difficulty Swallowing, Excessive gas, Gets full quickly at meals, Hemorrhoids, Indigestion, Nausea, Rectal Pain and Vomiting. Female Genitourinary Not Present- Frequency, Nocturia, Painful Urination, Pelvic Pain and Urgency. Musculoskeletal Not Present- Back Pain, Joint Pain, Joint Stiffness, Muscle Pain, Muscle Weakness and Swelling of Extremities. Neurological Not Present- Decreased Memory, Fainting, Headaches, Numbness, Seizures, Tingling, Tremor, Trouble walking and Weakness. Psychiatric Not Present- Anxiety, Bipolar, Change in Sleep  Pattern, Depression, Fearful and Frequent  crying. Endocrine Not Present- Cold Intolerance, Excessive Hunger, Hair Changes, Heat Intolerance, Hot flashes and New Diabetes. Hematology Not Present- Blood Thinners, Easy Bruising, Excessive bleeding, Gland problems, HIV and Persistent Infections.  Vitals Lattie Haw [place] RMA; 03/03/2020 10:16 AM) 03/03/2020 10:14 AM Weight: 138 lb Height: 59in Body Surface Area: 1.57 m Body Mass Index: 27.87 kg/m  Temp.: 98.67F  Pulse: 73 (Regular)  P.OX: 99% (Room air) BP: 126/88(Sitting, Left Arm, Standard)       Physical Exam Rolm Bookbinder MD; 03/04/2020 4:49 PM) General Mental Status-Alert. Orientation-Oriented X3.  Breast Nipples-No Discharge. Breast Lump-No Palpable Breast Mass. Note: no breast mass, I just feel hematoma today   Lymphatic Head & Neck  General Head & Neck Lymphatics: Bilateral - Description - Normal. Axillary  General Axillary Region: Bilateral - Description - Normal. 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 I discussed repeating genetic testing due to her family history as well as her personal history. I discussed panel testing with implications as well as all the possible results. this wont change her treatment but may be beneficial to her family I discussed options of repeat lumpectomy but recommended an MRI and likely this area is too large anyways with her current breast. I think mastectomy is best option. we discussed mastectomy with drains, she does not desire reconstruction. discussed risks and recovery. she is at higher risk for wound issues given prior radiation and understands that. also discussed sn biopsy. I think this is unnecessary and no survival benefit. I dont think will get chemo or radiation  likelywith this tumor. I think reasonable to omit in this 79 yo who is still active with no ue issues after prior surgery. she is agreeable. will refer to med onc and schedule asap.

## 2020-03-16 NOTE — Op Note (Signed)
Preoperative diagnosis: Left breast cancer after prior breast conservation therapy and radiation Postoperative diagnosis: Same as above Procedure: Left mastectomy Surgeon: Dr. Serita Grammes Anesthesia: General with a pectoral block Estimated blood loss: 30 cc Drains: 19 French Blake drain Specimens: Left breast tissue marked short superior, long lateral Complications: None Special count was correct x2 at end of operation Disposition recovery stable condition  Indications: 25 yof referred for new left breast cancer. she has history of a right breast cancer in 1999 treated with lumpectomy/sn/radiotherapy/antiestrogens. she also has a history of left breast cancer treated in 2000 with same treatment. never had chemo. In June she had screening mm that showed a benign cluster of cysts in luoq with [place]. she was planned for 12 month follow up. she then developed some left nipple inversion and erythema. this was treated with abx and did resolve. she states it has not returned. she then had repeat mm/us. her density is b. she had a new 2.3 cm area of multiple small masses in luoq. there was a second 2 cm area as well. the most anterior mass is 1.7x1.1x1.4 cm in size and posteriorly there is a 7x5x4 mm mass. there are no abnormal nodes in her axilla. 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 We elected to proceed with mastectomy.   Procedure: After informed consent was obtained the patient first underwent a pectoral block.  She was injected with technetium in the standard periareolar fashion.  She was given antibiotics.  SCDs were in place.  She was then placed under general anesthesia without complication.  She was prepped and draped in the standard sterile surgical fashion.  Surgical timeout was then performed.  I made a reduction pattern incision to decrease the lateral tissue.   I then created flaps to near the clavicle,  parasternal area, inframammary fold, and latissimus laterally.   This was then passed off the table and marked as above.Hemostasis was obtained.I then placed a 51 [place] Blake drain and secured this with a 2-0 nylon suture.  I then closed her dermis with multiple 3-0 Vicryl sutures.I placed a 3-0 nylon at the t junction.   I then closed it with a 4-0 Monocryl.  Glue was placed.  Dressings were placed.  She tolerated this well was extubated and transferred to the recovery room in stable condition.

## 2020-03-16 NOTE — Interval H&P Note (Signed)
History and Physical Interval Note:  03/16/2020 7:09 AM  Sarah Barrett  has presented today for surgery, with the diagnosis of LEFT BREAST CANCER, RECURRENT.  The various methods of treatment have been discussed with the patient and family. After consideration of risks, benefits and other options for treatment, the patient has consented to  Procedure(s) with comments: LEFT MASTECTOMY (Left) - PEC BLOCK as a surgical intervention.  The patient's history has been reviewed, patient examined, no change in status, stable for surgery.  I have reviewed the patient's chart and labs.  Questions were answered to the patient's satisfaction.     Rolm Bookbinder

## 2020-03-16 NOTE — Anesthesia Procedure Notes (Signed)
Anesthesia Regional Block: Pectoralis block   Pre-Anesthetic Checklist: ,, timeout performed, Correct Patient, Correct Site, Correct Laterality, Correct Procedure, Correct Position, site marked, Risks and benefits discussed,  Surgical consent,  Pre-op evaluation,  At surgeon's request and post-op pain management  Laterality: Left  Prep: Dura Prep       Needles:  Injection technique: Single-shot  Needle Type: Echogenic Stimulator Needle     Needle Length: 4cm  Needle Gauge: 20     Additional Needles:   Procedures:,,,, ultrasound used (permanent image in chart),,,,  Narrative:  Start time: 03/16/2020 7:00 AM End time: 03/16/2020 7:10 AM Injection made incrementally with aspirations every 5 mL.  Performed by: Personally  Anesthesiologist: Darral Dash, DO  Additional Notes: Patient identified. Risks/Benefits/Options discussed with patient including but not limited to bleeding, infection, nerve damage, failed block, incomplete pain control. Patient expressed understanding and wished to proceed. All questions were answered. Sterile technique was used throughout the entire procedure. Please see nursing notes for vital signs. Aspirated in 5cc intervals with injection for negative confirmation. Patient was given instructions on fall risk and not to get out of bed. All questions and concerns addressed with instructions to call with any issues or inadequate analgesia.

## 2020-03-16 NOTE — Transfer of Care (Signed)
Immediate Anesthesia Transfer of Care Note  Patient: Sarah Barrett  Procedure(s) Performed: LEFT MASTECTOMY (Left Breast)  Patient Location: PACU  Anesthesia Type:GA combined with regional for post-op pain  Level of Consciousness: drowsy and responds to stimulation  Airway & Oxygen Therapy: Patient Spontanous Breathing and Patient connected to face mask oxygen  Post-op Assessment: Report given to RN and Post -op Vital signs reviewed and stable  Post vital signs: Reviewed and stable  Last Vitals:  Vitals Value Taken Time  BP 116/57 03/16/20 0857  Temp    Pulse 67 03/16/20 0858  Resp 12 03/16/20 0858  SpO2 99 % 03/16/20 0858  Vitals shown include unvalidated device data.  Last Pain:  Vitals:   03/16/20 0624  TempSrc:   PainSc: 0-No pain      Patients Stated Pain Goal: 3 (14/97/02 6378)  Complications: No complications documented.

## 2020-03-16 NOTE — Anesthesia Procedure Notes (Signed)
Procedure Name: LMA Insertion Date/Time: 03/16/2020 7:31 AM Performed by: Rande Brunt, CRNA Pre-anesthesia Checklist: Patient identified, Emergency Drugs available, Suction available and Patient being monitored Patient Re-evaluated:Patient Re-evaluated prior to induction Oxygen Delivery Method: Circle System Utilized Preoxygenation: Pre-oxygenation with 100% oxygen Induction Type: IV induction Ventilation: Mask ventilation without difficulty LMA: LMA inserted LMA Size: 3.0 Number of attempts: 1 Placement Confirmation: positive ETCO2 Tube secured with: Tape Dental Injury: Teeth and Oropharynx as per pre-operative assessment

## 2020-03-17 ENCOUNTER — Encounter (HOSPITAL_COMMUNITY): Payer: Self-pay | Admitting: General Surgery

## 2020-03-17 NOTE — Discharge Summary (Signed)
Physician Discharge Summary  Patient ID: Sarah Barrett MRN: [id number] DOB/AGE: 12-27-1940 79 y.o.  Admit date: 03/16/2020 Discharge date: 03/17/2020  Admission Diagnoses: Left breast cancer after prior bct  Discharge Diagnoses:  Active Problems:   S/P mastectomy, left   Discharged Condition: good  Hospital Course: 79 yof who has new left breast cancer after prior bct.  She underwent left mastectomy without difficulty.  She is doing well sitting up eating breakfast following morning.  Drain was clotted but I fixed this. Has small hematoma present.  Consults: None  Significant Diagnostic Studies: none  Treatments: surgery: left mastectomy  Discharge Exam: Blood pressure (!) 114/51, pulse 67, temperature 97.6 F (36.4 C), temperature source Oral, resp. rate 18, height 4\' 11"  (1.499 m), weight 59.9 kg, SpO2 94 %. flaps viable, some ecchymosis present, I placed binder appropriately  Disposition: Discharge disposition: 01-Home or Self Care        Allergies as of 03/17/2020      Reactions   Morphine And Related Other (See Comments)   Hallucinations, doesn't like how it made her feel      Medication List    TAKE these medications   CALCIUM 600+D3 PO Take 1 tablet by mouth daily.   Fish Oil 1000 MG Caps Take 1,000 mg by mouth daily.   Flaxseed Oil 1000 MG Caps Take 1,000 mg by mouth daily.   hydroxypropyl methylcellulose / hypromellose 2.5 % ophthalmic solution Commonly known as: ISOPTO TEARS / GONIOVISC Place 1 drop into both eyes 3 (three) times daily as needed for dry eyes.   multivitamin with minerals Tabs tablet Take 1 tablet by mouth daily. One-A-Day for Women       Follow-up Information    Rolm Bookbinder, MD In 2 weeks.   Specialty: General Surgery Contact information: [address] [place] [address] [phone]               Signed: Rolm Bookbinder 03/17/2020, 7:49 AM

## 2020-03-17 NOTE — Progress Notes (Signed)
Patient is discharged from room 3C03 at this time. Alert and in stable condition. IV site d/c'd and instructions read to patient and daughter with understanding verbalized and all questions answered. Left unit via wheelchair with all belongings at side.

## 2020-03-17 NOTE — Discharge Instructions (Signed)
[facility], [place] [phone]  MASTECTOMY: POST OP INSTRUCTIONS Take 650 mg tylenol every 6 hours for next 72 hours then as needed. Use ice several times daily also. Always review your discharge instruction sheet given to you by the facility where your surgery was performed. IF YOU HAVE DISABILITY OR FAMILY LEAVE FORMS, YOU MUST BRING THEM TO THE OFFICE FOR PROCESSING.   DO NOT GIVE THEM TO YOUR DOCTOR. A prescription for pain medication may be given to you upon discharge.  Take your pain medication as prescribed, if needed.  If narcotic pain medicine is not needed, then you may take acetaminophen (Tylenol), naprosyn (Alleve) or ibuprofen (Advil) as needed. 1. Take your usually prescribed medications unless otherwise directed. 2. If you need a refill on your pain medication, please contact your pharmacy.  They will contact our office to request authorization.  Prescriptions will not be filled after 5pm or on week-ends. 3.   Resume your normal diet the day after surgery. 4. Most patients will experience some swelling and bruising on the chest and underarm.  Ice packs will help.  Swelling and bruising can take several days to resolve. Wear the binder day and night until you return to the office except while showering.  5. It is common to experience some constipation if taking pain medication after surgery.  Increasing fluid intake and taking a stool softener (such as Colace) will usually help or prevent this problem from occurring.  A mild laxative (Milk of Magnesia or Miralax) should be taken according to package instructions if there are no bowel movements after 48 hours. 6. You may shower on Saturday morning.  7. DRAINS:  If you have drains in place, it is important to keep a list of the amount of drainage produced each day in your drains.  Before leaving the hospital, you should be instructed on drain care.  Call our office if you have any questions about your drains. I will remove  your drains when they put out less than 30 cc or ml for 2 consecutive days. 8. ACTIVITIES:  You may resume regular (light) daily activities beginning the next day--such as daily self-care, walking, climbing stairs--gradually increasing activities as tolerated.    Refrain from any heavy lifting or straining until approved by your doctor. a. You may drive when you are no longer taking prescription pain medication, you can comfortably wear a seatbelt, and you can safely maneuver your car and apply brakes. b. RETURN TO WORK:  __________________________________________________________ 9. We will make a follow up when I call you with pathology next week.    Expect your pathology report 3-4business days after surgery. 10. OTHER INSTRUCTIONS: ______________________________________________________________________________________________ ____________________________________________________________________________________________ WHEN TO CALL YOUR DR Sarah Barrett: 1. Fever over 101.0 2. Nausea and/or vomiting 3. Extreme swelling or bruising 4. Continued bleeding from incision. 5. Increased pain, redness, or drainage from the incision. The clinic staff is available to answer your questions during regular business hours.  Please don't hesitate to call and ask to speak to one of the nurses for clinical concerns.  If you have a medical emergency, go to the nearest emergency room or call 911.  A surgeon from [facility] is always on call at the hospital. [address] [place], [place], [place]  [address] ? [address], [place], [place]   [address] [phone] ? [phone] ? FAX [phone] Web site: [email or web address]

## 2020-03-23 ENCOUNTER — Encounter: Payer: Self-pay | Admitting: *Deleted

## 2020-03-24 ENCOUNTER — Telehealth: Payer: Self-pay | Admitting: Hematology and Oncology

## 2020-03-24 NOTE — Telephone Encounter (Signed)
Received a new pt referral from Dr. Donne Hazel for breast cancer. Sarah Barrett has been cld and scheduled to see Sarah Barrett on 11/15 at 345pm. Pt aware to arrive 15 minutes early.

## 2020-03-25 DIAGNOSIS — Z23 Encounter for immunization: Secondary | ICD-10-CM | POA: Diagnosis not present

## 2020-03-28 ENCOUNTER — Ambulatory Visit: Payer: Medicare HMO | Admitting: Plastic Surgery

## 2020-03-31 ENCOUNTER — Encounter: Payer: Self-pay | Admitting: Plastic Surgery

## 2020-03-31 ENCOUNTER — Ambulatory Visit (INDEPENDENT_AMBULATORY_CARE_PROVIDER_SITE_OTHER): Payer: Medicare HMO | Admitting: Plastic Surgery

## 2020-03-31 ENCOUNTER — Other Ambulatory Visit: Payer: Self-pay

## 2020-03-31 VITALS — BP 161/77 | HR 73

## 2020-03-31 DIAGNOSIS — Z719 Counseling, unspecified: Secondary | ICD-10-CM | POA: Diagnosis not present

## 2020-03-31 NOTE — Progress Notes (Signed)
   Subjective:    Patient ID: Sarah Barrett, female    DOB: 1941-01-30, 79 y.o.   MRN: [id number]  Patient is an 79 year old female here for follow-up after undergoing abdominal surgery for scar contracture.  Overall she is doing extremely well and is very happy with her abdomen.  Unfortunately she was recently diagnosed with breast cancer and underwent a mastectomy of the left breast with Dr. Donne Hazel.  She completed that 2 weeks ago.  Overall she is doing well and recovering nicely.  Her abdomen is completely healed and there is no sign of hernia or seroma.     Review of Systems  Constitutional: Positive for activity change.  HENT: Negative.   Eyes: Negative.   Respiratory: Negative.   Cardiovascular: Negative.   Endocrine: Negative.   Genitourinary: Negative.        Objective:   Physical Exam Vitals and nursing note reviewed.  Constitutional:      Appearance: Normal appearance.  HENT:     Head: Normocephalic and atraumatic.  Cardiovascular:     Rate and Rhythm: Normal rate.     Pulses: Normal pulses.  Skin:    Capillary Refill: Capillary refill takes less than 2 seconds.  Neurological:     General: No focal deficit present.     Mental Status: She is alert and oriented to person, place, and time.  Psychiatric:        Mood and Affect: Mood normal.        Behavior: Behavior normal.       Assessment & Plan:     ICD-10-CM   1. Encounter for counseling  Z71.9     New is light massage to the abdomen as able.  Follow-up as needed remain available to Sarah Barrett who has been an absolute pleasure to treat in my office.  She will let us know if we can do anything for her.  No additional abdominal surgery will be needed.

## 2020-04-02 NOTE — Progress Notes (Signed)
[name] CONSULT NOTE  Patient Care Team: Kelton Pillar, MD as PCP - General (Family Medicine)  CHIEF COMPLAINTS/PURPOSE OF CONSULTATION:  Newly diagnosed breast cancer  HISTORY OF PRESENTING ILLNESS:  Sarah Barrett 79 y.o. female is here because of recent diagnosis of left breast cancer. Diagnostic mammogram on 10/28/19 showed a cluster of masses in the upper outer left breast. US showed no evidence of malignancy and annual follow-up mammogram was recommended. Patient presented to PCP with swollen, erythematous left breast and an inverted nipple. Mammogram and [place] on 02/01/20 showed two masses at the 2 o'clock position, 1.7cm and 0.7cm. Biopsy on 02/21/20 showed invasive ductal carcinoma, grade 2, HER-2 negative (1+), ER/PR+ 95%, KI67 15%. She underwent a left mastectomy on 03/16/20 with Dr. 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 She has a personal history of breast cancer in 1999 for which she underwent a lumpectomy  She has family history of breast cancer in her mother at 57yo and maternal grandmother at 54yo. She presents to the clinic today to discuss further treatment.   I reviewed her records extensively and collaborated the history with the patient.  SUMMARY OF ONCOLOGIC HISTORY: Oncology History  Malignant neoplasm of upper-outer quadrant of left breast in female, estrogen receptor positive ([place])  02/21/2020 Initial Diagnosis   Patient presented to PCP with swollen, erythematous left breast and an inverted nipple. Mammogram and [place] on 02/01/20 showed two masses at the 2 o'clock position, 1.7cm and 0.7cm. Biopsy on 02/21/20 showed invasive ductal carcinoma, grade 2, HER-2 negative (1+), ER/PR+ 95%, KI67 15%.    03/16/2020 Surgery   left mastectomy (Dr. 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  (Personal history  of breast cancer in 1999 for which she underwent a lumpectomy)     MEDICAL HISTORY:  Past Medical History:  Diagnosis Date  . Arm mass, right   . Cancer [facility])    breast  . History of bilateral breast cancer released from oncologist-- dr Truddie Coco 2010, (02-01-2019 per pt no recurrence)   dx right side 1999 s/p  breast lumpectomy and IMRT;  dx left side 2000 s/p breast lumpectomy and completed IMRT 2000  . OA (osteoarthritis)   . Shoulder mass    right  . Wears hearing aid in both ears   . Wears partial dentures    upper    SURGICAL HISTORY: Past Surgical History:  Procedure Laterality Date  . ABDOMINAL HYSTERECTOMY    . BREAST LUMPECTOMY Bilateral right 1999; left 04/ 2000  . CATARACT EXTRACTION W/ INTRAOCULAR LENS  IMPLANT, BILATERAL  yrs ago  . COLONOSCOPY    . KNEE ARTHROSCOPY W/ MENISCAL REPAIR Right 2018  . LESION EXCISION WITH COMPLEX REPAIR Bilateral 03/11/2019   Procedure: Bilateral excision of eye lid xanthelasma;  Surgeon: Wallace Going, DO;  Location: [place];  Service: Plastics;  Laterality: Bilateral;  1 hour, please  . LUMBAR FUSION  07-07-2001   dr Carloyn Manner _0    L5 --S1   . MASS EXCISION N/A 02/04/2019   Procedure: EXCISION OF RIGHT SHOULDER SKIN MASS AND LEFT ARM SKIN MASS;  Surgeon: Kieth Brightly, Arta Bruce, MD;  Location: [place];  Service: General;  Laterality: N/A;  . REMOVAL OF URINARY SLING  12-20-2003   dr Jeffie Pollock _1   . SCAR REVISION N/A 07/21/2019   Procedure: Excision and release of abdominal wall  contracture;  Surgeon: Wallace Going, DO;  Location: [place];  Service: Plastics;  Laterality: N/A;  2 hours  . SIMPLE MASTECTOMY WITH AXILLARY SENTINEL NODE BIOPSY Left 03/16/2020   Procedure: LEFT MASTECTOMY;  Surgeon: Rolm Bookbinder, MD;  Location: [place];  Service: General;  Laterality: Left;  PEC BLOCK  . TOTAL ABDOMINAL HYSTERECTOMY W/ BILATERAL SALPINGOOPHORECTOMY  1995  . TRANSOBTURATOR SLING   08/2003  . WISDOM TOOTH EXTRACTION      SOCIAL HISTORY: Social History   Socioeconomic History  . Marital status: Married    Spouse name: Not on file  . Number of children: Not on file  . Years of education: Not on file  . Highest education level: Not on file  Occupational History  . Not on file  Tobacco Use  . Smoking status: Never Smoker  . Smokeless tobacco: Never Used  Vaping Use  . Vaping Use: Never used  Substance and Sexual Activity  . Alcohol use: No  . Drug use: Never  . Sexual activity: Not Currently    Birth control/protection: Surgical, Post-menopausal    Comment: Hysterectomy  Other Topics Concern  . Not on file  Social History Narrative  . Not on file   Social Determinants of Health   Financial Resource Strain:   . Difficulty of Paying Living Expenses: Not on file  Food Insecurity:   . Worried About Charity fundraiser in the Last Year: Not on file  . Ran Out of Food in the Last Year: Not on file  Transportation Needs:   . Lack of Transportation (Medical): Not on file  . Lack of Transportation (Non-Medical): Not on file  Physical Activity:   . Days of Exercise per Week: Not on file  . Minutes of Exercise per Session: Not on file  Stress:   . Feeling of Stress : Not on file  Social Connections:   . Frequency of Communication with Friends and Family: Not on file  . Frequency of Social Gatherings with Friends and Family: Not on file  . Attends Religious Services: Not on file  . Active Member of Clubs or Organizations: Not on file  . Attends Archivist Meetings: Not on file  . Marital Status: Not on file  Intimate Partner Violence:   . Fear of Current or Ex-Partner: Not on file  . Emotionally Abused: Not on file  . Physically Abused: Not on file  . Sexually Abused: Not on file    FAMILY HISTORY: Family History  Problem Relation Age of Onset  . Breast cancer Mother   . Breast cancer Maternal Grandmother   . Cancer Sister   .  Cervical cancer Maternal Aunt   . Colon cancer Neg Hx   . Rectal cancer Neg Hx   . Stomach cancer Neg Hx     ALLERGIES:  is allergic to morphine and related.  MEDICATIONS:  Current Outpatient Medications  Medication Sig Dispense Refill  . anastrozole (ARIMIDEX) 1 MG tablet Take 1 tablet (1 mg total) by mouth daily. 90 tablet 3  . Calcium Carb-Cholecalciferol (CALCIUM 600+D3 PO) Take 1 tablet by mouth daily.    . Flaxseed, Linseed, (FLAXSEED OIL) 1000 MG CAPS Take 1,000 mg by mouth daily.    . hydroxypropyl methylcellulose / hypromellose (ISOPTO TEARS / GONIOVISC) 2.5 % ophthalmic solution Place 1 drop into both eyes 3 (three) times daily as needed for dry eyes.     . Multiple Vitamin (MULTIVITAMIN WITH MINERALS) TABS tablet Take 1  tablet by mouth daily. One-A-Day for Women    . Omega-3 Fatty Acids (FISH OIL) 1000 MG CAPS Take 1,000 mg by mouth daily.     No current facility-administered medications for this visit.    REVIEW OF SYSTEMS:   As above all systems negative  PHYSICAL EXAMINATION: ECOG PERFORMANCE STATUS: 1 - Symptomatic but completely ambulatory  Vitals:   04/03/20 1556  BP: (!) 179/86  Pulse: 72  Resp: 16  Temp: 97.8 F (36.6 C)  SpO2: 99%   Filed Weights   04/03/20 1556  Weight: 138 lb 12.8 oz (63 kg)       LABORATORY DATA:  I have reviewed the data as listed Lab Results  Component Value Date   WBC 4.9 03/16/2020   HGB 14.5 03/16/2020   HCT 44.8 03/16/2020   MCV 93.3 03/16/2020   PLT 188 03/16/2020   Lab Results  Component Value Date   NA 139 02/04/2019   K 4.0 02/04/2019   CL 103 02/04/2019   CO2 26 10/12/2008    RADIOGRAPHIC STUDIES: I have personally reviewed the radiological reports and agreed with the findings in the report.  ASSESSMENT AND PLAN:  Malignant neoplasm of upper-outer quadrant of left breast in female, estrogen receptor positive ([place]) 03/16/2020: Left mastectomy (Dr. 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  (Personal history of breast cancer in 1999 for which she underwent a lumpectomy) T1b N0 stage Ia  Pathology counseling: I discussed the final pathology report of the patient provided  a copy of this report. I discussed the margins as well as lymph node surgeries. We also discussed the final staging along with previously performed ER/PR and HER-2/neu testing.  Recommendation: Adjuvant antiestrogen therapy with anastrozole 1 mg daily x5 to 7 years Anastrozole counseling:We discussed the risks and benefits of anti-estrogen therapy with aromatase inhibitors. These include but not limited to insomnia, hot flashes, mood changes, vaginal dryness, bone density loss, and weight gain. We strongly believe that the benefits far outweigh the risks. Patient understands these risks and consented to starting treatment. Planned treatment duration is 5-7 years.   Return to clinic in 3 months for survivorship care plan visit    All questions were answered. The patient knows to call the clinic with any problems, questions or concerns.   Rulon Eisenmenger, MD, MPH 04/03/2020    I, Molly Dorshimer, am acting as scribe for Nicholas Lose, MD.  I have reviewed the above documentation for accuracy and completeness, and I agree with the above.

## 2020-04-03 ENCOUNTER — Encounter: Payer: Self-pay | Admitting: *Deleted

## 2020-04-03 ENCOUNTER — Inpatient Hospital Stay: Payer: Medicare HMO | Attending: Hematology and Oncology | Admitting: Hematology and Oncology

## 2020-04-03 ENCOUNTER — Other Ambulatory Visit: Payer: Self-pay

## 2020-04-03 DIAGNOSIS — Z79811 Long term (current) use of aromatase inhibitors: Secondary | ICD-10-CM | POA: Diagnosis not present

## 2020-04-03 DIAGNOSIS — Z170 Estrogen receptor positive status [ER+]: Secondary | ICD-10-CM | POA: Diagnosis not present

## 2020-04-03 DIAGNOSIS — Z803 Family history of malignant neoplasm of breast: Secondary | ICD-10-CM | POA: Insufficient documentation

## 2020-04-03 DIAGNOSIS — Z9012 Acquired absence of left breast and nipple: Secondary | ICD-10-CM | POA: Insufficient documentation

## 2020-04-03 DIAGNOSIS — C50412 Malignant neoplasm of upper-outer quadrant of left female breast: Secondary | ICD-10-CM | POA: Insufficient documentation

## 2020-04-03 DIAGNOSIS — Z79899 Other long term (current) drug therapy: Secondary | ICD-10-CM | POA: Diagnosis not present

## 2020-04-03 DIAGNOSIS — M1990 Unspecified osteoarthritis, unspecified site: Secondary | ICD-10-CM | POA: Diagnosis not present

## 2020-04-03 MED ORDER — ANASTROZOLE 1 MG PO TABS: 1.0000 mg | ORAL_TABLET | Freq: Every day | ORAL | 3 refills | Status: DC

## 2020-04-03 NOTE — Assessment & Plan Note (Signed)
03/16/2020: Left mastectomy (Dr. 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  (Personal history of breast cancer in 1999 for which she underwent a lumpectomy) T1b N0 stage Ia  Pathology counseling: I discussed the final pathology report of the patient provided  a copy of this report. I discussed the margins as well as lymph node surgeries. We also discussed the final staging along with previously performed ER/PR and HER-2/neu testing.  Recommendation: Adjuvant antiestrogen therapy with anastrozole 1 mg daily x5 to 7 years Anastrozole counseling:We discussed the risks and benefits of anti-estrogen therapy with aromatase inhibitors. These include but not limited to insomnia, hot flashes, mood changes, vaginal dryness, bone density loss, and weight gain. We strongly believe that the benefits far outweigh the risks. Patient understands these risks and consented to starting treatment. Planned treatment duration is 5-7 years.   Return to clinic in 3 months for survivorship care plan visit

## 2020-04-05 LAB — SURGICAL PATHOLOGY

## 2020-05-11 ENCOUNTER — Telehealth: Payer: Self-pay | Admitting: Adult Health

## 2020-05-11 ENCOUNTER — Telehealth: Payer: Self-pay | Admitting: Hematology and Oncology

## 2020-05-11 NOTE — Telephone Encounter (Signed)
Rescheduled appointment due to provider day off. Patient is aware of changes. Patient had a medication question and requested a call back. Messaged provider to call back to discuss.

## 2020-05-11 NOTE — Telephone Encounter (Signed)
I left a voicemail for the patient stating that if anastrozole is truly causing her elevated blood pressure she could hold it and then see if the blood pressure improves.  Alternately I instructed her to call her primary care physician and adjust her blood pressure medications.  I instructed her to call us back in a couple of weeks to inform us of what is happening.

## 2020-05-22 ENCOUNTER — Telehealth: Payer: Self-pay

## 2020-05-22 MED ORDER — TAMOXIFEN CITRATE 20 MG PO TABS: 20.0000 mg | ORAL_TABLET | Freq: Every day | ORAL | 2 refills | Status: DC

## 2020-05-22 NOTE — Telephone Encounter (Signed)
Pt called to request MD to change medication anastrozole to Tamoxifen.  Pt started on Anastrozole 11/15.  Pt states her blood pressure has been increasing since she started medication.  Patient reports she was previously on Tamoxifen and tolerated well.     Pt reports BP readings of 170-180 for systolic and 88-95 for diastolic.  Pt denies any headache, blurred vision, or chest pain.  Pt made apt with PCP for 1/4 for evaluation.   RN reviewed with MD - MD recommendations to hold anastrozole for 2 weeks, and then can start Tamoxifen.  RN educated on side effects including risk for blood clots.  Patient verbalized understanding and agreement.  Pt will keep PCP appointment as well for evaluation of BP.

## 2020-05-23 DIAGNOSIS — I10 Essential (primary) hypertension: Secondary | ICD-10-CM | POA: Diagnosis not present

## 2020-05-24 DIAGNOSIS — C50412 Malignant neoplasm of upper-outer quadrant of left female breast: Secondary | ICD-10-CM | POA: Diagnosis not present

## 2020-06-06 DIAGNOSIS — C50919 Malignant neoplasm of unspecified site of unspecified female breast: Secondary | ICD-10-CM | POA: Diagnosis not present

## 2020-06-06 DIAGNOSIS — I10 Essential (primary) hypertension: Secondary | ICD-10-CM | POA: Diagnosis not present

## 2020-06-06 DIAGNOSIS — E785 Hyperlipidemia, unspecified: Secondary | ICD-10-CM | POA: Diagnosis not present

## 2020-06-13 DIAGNOSIS — M545 Low back pain, unspecified: Secondary | ICD-10-CM | POA: Diagnosis not present

## 2020-06-15 DIAGNOSIS — M47896 Other spondylosis, lumbar region: Secondary | ICD-10-CM | POA: Diagnosis not present

## 2020-06-20 DIAGNOSIS — M47896 Other spondylosis, lumbar region: Secondary | ICD-10-CM | POA: Diagnosis not present

## 2020-06-22 DIAGNOSIS — E785 Hyperlipidemia, unspecified: Secondary | ICD-10-CM | POA: Diagnosis not present

## 2020-06-22 DIAGNOSIS — Z853 Personal history of malignant neoplasm of breast: Secondary | ICD-10-CM | POA: Diagnosis not present

## 2020-06-22 DIAGNOSIS — C50412 Malignant neoplasm of upper-outer quadrant of left female breast: Secondary | ICD-10-CM | POA: Diagnosis not present

## 2020-06-22 DIAGNOSIS — Z0000 Encounter for general adult medical examination without abnormal findings: Secondary | ICD-10-CM | POA: Diagnosis not present

## 2020-06-22 DIAGNOSIS — M47896 Other spondylosis, lumbar region: Secondary | ICD-10-CM | POA: Diagnosis not present

## 2020-06-22 DIAGNOSIS — Z1389 Encounter for screening for other disorder: Secondary | ICD-10-CM | POA: Diagnosis not present

## 2020-06-22 DIAGNOSIS — I10 Essential (primary) hypertension: Secondary | ICD-10-CM | POA: Diagnosis not present

## 2020-06-27 DIAGNOSIS — M47896 Other spondylosis, lumbar region: Secondary | ICD-10-CM | POA: Diagnosis not present

## 2020-06-29 DIAGNOSIS — M47896 Other spondylosis, lumbar region: Secondary | ICD-10-CM | POA: Diagnosis not present

## 2020-07-03 DIAGNOSIS — Z780 Asymptomatic menopausal state: Secondary | ICD-10-CM | POA: Diagnosis not present

## 2020-07-03 DIAGNOSIS — M8589 Other specified disorders of bone density and structure, multiple sites: Secondary | ICD-10-CM | POA: Diagnosis not present

## 2020-07-04 ENCOUNTER — Encounter: Payer: Medicare HMO | Admitting: Adult Health

## 2020-07-04 DIAGNOSIS — M47896 Other spondylosis, lumbar region: Secondary | ICD-10-CM | POA: Diagnosis not present

## 2020-07-05 ENCOUNTER — Inpatient Hospital Stay: Payer: Medicare HMO | Admitting: Adult Health

## 2020-07-06 DIAGNOSIS — M47896 Other spondylosis, lumbar region: Secondary | ICD-10-CM | POA: Diagnosis not present

## 2020-07-13 ENCOUNTER — Other Ambulatory Visit: Payer: Self-pay

## 2020-07-13 ENCOUNTER — Encounter: Payer: Self-pay | Admitting: Adult Health

## 2020-07-13 ENCOUNTER — Inpatient Hospital Stay: Payer: Medicare HMO | Attending: Hematology and Oncology | Admitting: Adult Health

## 2020-07-13 VITALS — BP 163/74 | HR 70 | Temp 97.6°F | Resp 17 | Ht 59.0 in | Wt 140.3 lb

## 2020-07-13 DIAGNOSIS — Z170 Estrogen receptor positive status [ER+]: Secondary | ICD-10-CM | POA: Insufficient documentation

## 2020-07-13 DIAGNOSIS — Z79811 Long term (current) use of aromatase inhibitors: Secondary | ICD-10-CM | POA: Diagnosis not present

## 2020-07-13 DIAGNOSIS — Z9012 Acquired absence of left breast and nipple: Secondary | ICD-10-CM | POA: Insufficient documentation

## 2020-07-13 DIAGNOSIS — M1990 Unspecified osteoarthritis, unspecified site: Secondary | ICD-10-CM | POA: Insufficient documentation

## 2020-07-13 DIAGNOSIS — Z803 Family history of malignant neoplasm of breast: Secondary | ICD-10-CM | POA: Insufficient documentation

## 2020-07-13 DIAGNOSIS — Z79899 Other long term (current) drug therapy: Secondary | ICD-10-CM | POA: Insufficient documentation

## 2020-07-13 DIAGNOSIS — C50412 Malignant neoplasm of upper-outer quadrant of left female breast: Secondary | ICD-10-CM | POA: Insufficient documentation

## 2020-07-13 NOTE — Progress Notes (Signed)
SURVIVORSHIP VISIT:   BRIEF ONCOLOGIC HISTORY:  Oncology History  Malignant neoplasm of upper-outer quadrant of left breast in female, estrogen receptor positive ([place])  02/21/2020 Initial Diagnosis   Patient presented to PCP with swollen, erythematous left breast and an inverted nipple. Mammogram and [place] on 02/01/20 showed two masses at the 2 o'clock position, 1.7cm and 0.7cm. Biopsy on 02/21/20 showed invasive ductal carcinoma, grade 2, HER-2 negative (1+), ER/PR+ 95%, KI67 15%.    02/21/2020 Cancer Staging   Staging form: Breast, AJCC 8th Edition - Clinical stage from 02/21/2020: Stage IA (cT1c, cN0, cM0, G2, ER+, PR+, HER2-) Stage prefix: Initial diagnosis    03/16/2020 Surgery   Left mastectomy (Dr. Donne Hazel) [phone]): Multifocal invasive ductal carcinoma and DCIS, grade 2, 0.9cm, clear margins ER/PR 95% positive, HER-2 negative, Ki-67 15%.  (Personal history of breast cancer in 1999 for which she underwent a lumpectomy)   03/16/2020 Cancer Staging   Staging form: Breast, AJCC 8th Edition - Pathologic stage from 03/16/2020: Stage IA (pT1b, pN0, cM0, G2, ER+, PR+, HER2-) Histologic grading system: 3 grade system    03/2020 -  Anti-estrogen oral therapy   Anastrozole x 1 month, changed to Tamoxifen due to BP issues     INTERVAL HISTORY:  Sarah Barrett to review her survivorship care plan detailing her treatment course for breast cancer, as well as monitoring long-term side effects of that treatment, education regarding health maintenance, screening, and overall wellness and health promotion.     Overall, Sarah Barrett reports feeling quite well.  She is taking Tamoxifen daily and tolerates it pretty well.    REVIEW OF SYSTEMS:  Review of Systems  Constitutional: Negative for appetite change, chills, fatigue, fever and unexpected weight change.  HENT:   Negative for hearing loss, lump/mass and trouble swallowing.   Eyes: Negative for eye problems and icterus.  Respiratory: Negative for  chest tightness, cough and shortness of breath.   Cardiovascular: Negative for chest pain, leg swelling and palpitations.  Gastrointestinal: Negative for abdominal distention, abdominal pain, constipation, diarrhea, nausea and vomiting.  Endocrine: Negative for hot flashes.  Genitourinary: Negative for difficulty urinating.   Musculoskeletal: Negative for arthralgias.  Skin: Negative for itching and rash.  Neurological: Negative for dizziness, extremity weakness, headaches and numbness.  Hematological: Negative for adenopathy. Does not bruise/bleed easily.  Psychiatric/Behavioral: Negative for depression. The patient is not nervous/anxious.    Breast: Denies any new nodularity, masses, tenderness, nipple changes, or nipple discharge.      ONCOLOGY TREATMENT TEAM:  1. Surgeon:  Dr. Dr. Donne Hazel at [facility] 2. Medical Oncologist: Dr. Dr. Lindi Adie      PAST MEDICAL/SURGICAL HISTORY:  Past Medical History:  Diagnosis Date  . Arm mass, right   . Cancer [facility])    breast  . History of bilateral breast cancer released from oncologist-- dr Truddie Coco 2010, (02-01-2019 per pt no recurrence)   dx right side 1999 s/p  breast lumpectomy and IMRT;  dx left side 2000 s/p breast lumpectomy and completed IMRT 2000  . OA (osteoarthritis)   . Shoulder mass    right  . Wears hearing aid in both ears   . Wears partial dentures    upper   Past Surgical History:  Procedure Laterality Date  . ABDOMINAL HYSTERECTOMY    . BREAST LUMPECTOMY Bilateral right 1999; left 04/ 2000  . CATARACT EXTRACTION W/ INTRAOCULAR LENS  IMPLANT, BILATERAL  yrs ago  . COLONOSCOPY    . KNEE ARTHROSCOPY W/ MENISCAL REPAIR Right 2018  .  LESION EXCISION WITH COMPLEX REPAIR Bilateral 03/11/2019   Procedure: Bilateral excision of eye lid xanthelasma;  Surgeon: Wallace Going, DO;  Location: [place];  Service: Plastics;  Laterality: Bilateral;  1 hour, please  . LUMBAR FUSION  07-07-2001    dr Carloyn Manner '@[facility]'    L5 --S1   . MASS EXCISION N/A 02/04/2019   Procedure: EXCISION OF RIGHT SHOULDER SKIN MASS AND LEFT ARM SKIN MASS;  Surgeon: Kinsinger, Arta Bruce, MD;  Location[facility];  Service: General;  Laterality: N/A;  . REMOVAL OF URINARY SLING  12-20-2003   dr Jeffie Pollock '@[email or web address]'   . SCAR REVISION N/A 07/21/2019   Procedure: Excision and release of abdominal wall contracture;  Surgeon: Wallace Going, DO;  Location: [place];  Service: Plastics;  Laterality: N/A;  2 hours  . SIMPLE MASTECTOMY WITH AXILLARY SENTINEL NODE BIOPSY Left 03/16/2020   Procedure: LEFT MASTECTOMY;  Surgeon: Rolm Bookbinder, MD;  Location: [place];  Service: General;  Laterality: Left;  PEC BLOCK  . TOTAL ABDOMINAL HYSTERECTOMY W/ BILATERAL SALPINGOOPHORECTOMY  1995  . TRANSOBTURATOR SLING  08/2003  . WISDOM TOOTH EXTRACTION       ALLERGIES:  Allergies  Allergen Reactions  . Morphine And Related Other (See Comments)    Hallucinations, doesn't like how it made her feel  . Prednisone     Other reaction(s): diarrhea     CURRENT MEDICATIONS:  Outpatient Encounter Medications as of 07/13/2020  Medication Sig  . Calcium Carb-Cholecalciferol (CALCIUM 600+D3 PO) Take 1 tablet by mouth daily.  . Flaxseed, Linseed, (FLAXSEED OIL) 1000 MG CAPS Take 1,000 mg by mouth daily.  . hydroxypropyl methylcellulose / hypromellose (ISOPTO TEARS / GONIOVISC) 2.5 % ophthalmic solution Place 1 drop into both eyes 3 (three) times daily as needed for dry eyes.   Marland Kitchen lisinopril (ZESTRIL) 10 MG tablet Take 10 mg by mouth daily.  . Multiple Vitamin (MULTIVITAMIN WITH MINERALS) TABS tablet Take 1 tablet by mouth daily. One-A-Day for Women  . Omega-3 Fatty Acids (FISH OIL) 1000 MG CAPS Take 1,000 mg by mouth daily.  . tamoxifen (NOLVADEX) 20 MG tablet Take 1 tablet (20 mg total) by mouth daily.   No facility-administered encounter medications on file as of 07/13/2020.     ONCOLOGIC FAMILY HISTORY:   Family History  Problem Relation Age of Onset  . Breast cancer Mother   . Breast cancer Maternal Grandmother   . Cancer Sister   . Cervical cancer Maternal Aunt   . Colon cancer Neg Hx   . Rectal cancer Neg Hx   . Stomach cancer Neg Hx      GENETIC COUNSELING/TESTING: Not at this time  SOCIAL HISTORY:  Social History   Socioeconomic History  . Marital status: Married    Spouse name: Not on file  . Number of children: Not on file  . Years of education: Not on file  . Highest education level: Not on file  Occupational History  . Not on file  Tobacco Use  . Smoking status: Never Smoker  . Smokeless tobacco: Never Used  Vaping Use  . Vaping Use: Never used  Substance and Sexual Activity  . Alcohol use: No  . Drug use: Never  . Sexual activity: Not Currently    Birth control/protection: Surgical, Post-menopausal    Comment: Hysterectomy  Other Topics Concern  . Not on file  Social History Narrative  . Not on file   Social Determinants of Health   Financial Resource  Strain: Not on file  Food Insecurity: Not on file  Transportation Needs: Not on file  Physical Activity: Not on file  Stress: Not on file  Social Connections: Not on file  Intimate Partner Violence: Not on file     OBSERVATIONS/OBJECTIVE:  BP (!) 163/74 (BP Location: Right Arm, Patient Position: Sitting)   Pulse 70   Temp 97.6 F (36.4 C) (Temporal)   Resp 17   Ht '4\' 11"'  (1.499 m)   Wt 140 lb 4.8 oz (63.6 kg)   LMP  (LMP Unknown)   SpO2 97%   BMI 28.34 kg/m  GENERAL: Patient is a well appearing female in no acute distress HEENT:  Sclerae anicteric.  Oropharynx clear and moist. No ulcerations or evidence of oropharyngeal candidiasis. Neck is supple.  NODES:  No cervical, supraclavicular, or axillary lymphadenopathy palpated.  BREAST EXAM:  Left breast s/p mastectomy, no sign of local recurrence, right breast benign LUNGS:  Clear to auscultation bilaterally.  No wheezes or rhonchi. HEART:   Regular rate and rhythm. No murmur appreciated. ABDOMEN:  Soft, nontender.  Positive, normoactive bowel sounds. No organomegaly palpated. MSK:  No focal spinal tenderness to palpation. Full range of motion bilaterally in the upper extremities. EXTREMITIES:  No peripheral edema.   SKIN:  Clear with no obvious rashes or skin changes. No nail dyscrasia. NEURO:  Nonfocal. Well oriented.  Appropriate affect.    LABORATORY DATA:  None for this visit.  DIAGNOSTIC IMAGING:  None for this visit.      ASSESSMENT AND PLAN:  Ms.. Barrett is a pleasant 80 y.o. female with Stage IA left breast invasive ductal carcinoma, ER+/PR+/HER2-, diagnosed in 02/2020, treated with mastectomy, and anti-estrogen therapy with Tamoxifen beginning in 03/2020.  She presents to the Survivorship Clinic for our initial meeting and routine follow-up post-completion of treatment for breast cancer.    1. Stage IA left breast cancer:  Sarah Barrett is continuing to recover from definitive treatment for breast cancer. She will follow-up with her medical oncologist, Dr. Lindi Adie in 6 months with history and physical exam per surveillance protocol.  She will continue her anti-estrogen therapy with Tamoxifen. Thus far, she is tolerating the Tamoxifen well, with minimal side effects. She was instructed to make Dr. Lindi Adie or myself aware if she begins to experience any worsening side effects of the medication and I could see her back in clinic to help manage those side effects, as needed. Her mammogram is due 10/2020. Today, a comprehensive survivorship care plan and treatment summary was reviewed with the patient today detailing her breast cancer diagnosis, treatment course, potential late/long-term effects of treatment, appropriate follow-up care with recommendations for the future, and patient education resources.  A copy of this summary, along with a letter will be sent to the patient's primary care provider via mail/fax/In Basket message after  today's visit.    3. Bone health:  She was given education on specific activities to promote bone health.  4. Cancer screening:  Due to Sarah Barrett's history and her age, she should receive screening for skin cancers, colon cancer, and gynecologic cancers.  The information and recommendations are listed on the patient's comprehensive care plan/treatment summary and were reviewed in detail with the patient.    5. Health maintenance and wellness promotion: Sarah Barrett was encouraged to consume 5-7 servings of fruits and vegetables per day. We reviewed the "Nutrition Rainbow" handout, as well as the handout "Take Control of Your Health and Reduce Your Cancer Risk" from the American  Cancer Society.  She was also encouraged to engage in moderate to vigorous exercise for 30 minutes per day most days of the week. We discussed the [employer] fitness program, which is designed for cancer survivors to help them become more physically fit after cancer treatments.  She was instructed to limit her alcohol consumption and continue to abstain from tobacco use.     #. Support services/counseling: It is not uncommon for this period of the patient's cancer care trajectory to be one of many emotions and stressors.  We discussed how this can be increasingly difficult during the times of quarantine and social distancing due to the COVID-19 pandemic.   She was given information regarding our available services and encouraged to contact me with any questions or for help enrolling in any of our support group/programs.    Follow up instructions:    -Return to cancer center 6 months for f/u with Dr. Lindi Adie  -Mammogram due in 10/2020 -Follow up with surgery in one year -She is welcome to return back to the Survivorship Clinic at any time; no additional follow-up needed at this time.  -Consider referral back to survivorship as a long-term survivor for continued surveillance  The patient was provided an opportunity to ask  questions and all were answered. The patient agreed with the plan and demonstrated an understanding of the instructions.   Total encounter time: 30 minutes*  Wilber Bihari, NP 07/14/20 1:36 PM Medical Oncology and Hematology [facility] [place], [place] [address] Tel. [phone]    Fax. [phone]  *Total Encounter Time as defined by the Centers for Medicare and Medicaid Services includes, in addition to the face-to-face time of a patient visit (documented in the note above) non-face-to-face time: obtaining and reviewing outside history, ordering and reviewing medications, tests or procedures, care coordination (communications with other health care professionals or caregivers) and documentation in the medical record.

## 2020-07-14 ENCOUNTER — Telehealth: Payer: Self-pay | Admitting: Adult Health

## 2020-07-14 NOTE — Telephone Encounter (Signed)
Scheduled appts per 2/24 los. Pt confirmed appt date and time.  

## 2020-08-08 DIAGNOSIS — M545 Low back pain, unspecified: Secondary | ICD-10-CM | POA: Diagnosis not present

## 2020-08-28 ENCOUNTER — Other Ambulatory Visit: Payer: Self-pay | Admitting: Hematology and Oncology

## 2020-10-17 ENCOUNTER — Telehealth: Payer: Self-pay | Admitting: Hematology and Oncology

## 2020-10-17 NOTE — Telephone Encounter (Signed)
R/s per prov pal, per 2/24 los, pt aware.

## 2020-10-31 DIAGNOSIS — Z853 Personal history of malignant neoplasm of breast: Secondary | ICD-10-CM | POA: Diagnosis not present

## 2020-11-02 ENCOUNTER — Other Ambulatory Visit: Payer: Self-pay

## 2020-11-02 MED ORDER — TAMOXIFEN CITRATE 20 MG PO TABS
20.0000 mg | ORAL_TABLET | Freq: Every day | ORAL | 0 refills | Status: DC
Start: 2020-11-02 — End: 2021-01-19

## 2020-12-03 NOTE — Progress Notes (Signed)
Patient Care Team: Kelton Pillar, MD as PCP - General (Family Medicine) Nicholas Lose, MD as Consulting Physician (Hematology and Oncology) Rolm Bookbinder, MD as Consulting Physician (General Surgery)  DIAGNOSIS:    ICD-10-CM   1. Malignant neoplasm of upper-outer quadrant of left breast in female, estrogen receptor positive ([place])  C50.412    Z17.0       SUMMARY OF ONCOLOGIC HISTORY: Oncology History  Malignant neoplasm of upper-outer quadrant of left breast in female, estrogen receptor positive ([place])  02/21/2020 Initial Diagnosis   Patient presented to PCP with swollen, erythematous left breast and an inverted nipple. Mammogram and [place] on 02/01/20 showed two masses at the 2 o'clock position, 1.7cm and 0.7cm. Biopsy on 02/21/20 showed invasive ductal carcinoma, grade 2, HER-2 negative (1+), ER/PR+ 95%, KI67 15%.    02/21/2020 Cancer Staging   Staging form: Breast, AJCC 8th Edition - Clinical stage from 02/21/2020: Stage IA (cT1c, cN0, cM0, G2, ER+, PR+, HER2-) Stage prefix: Initial diagnosis    03/16/2020 Surgery   Left mastectomy (Dr. Donne Hazel) [phone]): Multifocal invasive ductal carcinoma and DCIS, grade 2, 0.9cm, clear margins ER/PR 95% positive, HER-2 negative, Ki-67 15%.  (Personal history of breast cancer in 1999 for which she underwent a lumpectomy)   03/16/2020 Cancer Staging   Staging form: Breast, AJCC 8th Edition - Pathologic stage from 03/16/2020: Stage IA (pT1b, pN0, cM0, G2, ER+, PR+, HER2-) Histologic grading system: 3 grade system    03/2020 -  Anti-estrogen oral therapy   Anastrozole x 1 month, changed to Tamoxifen due to BP issues     CHIEF COMPLIANT: Follow-up of left breast cancer  INTERVAL HISTORY: Sarah Barrett is a 80 y.o. with above-mentioned history of left breast cancer having undergone a left mastectomy, currently on antiestrogen therapy with tamoxifen.  She is reporting occasional muscle cramps but otherwise tolerating tamoxifen extremely  well.  She thinks that the leg cramps are about once a month.  ALLERGIES:  is allergic to morphine and related and prednisone.  MEDICATIONS:  Current Outpatient Medications  Medication Sig Dispense Refill   Calcium Carb-Cholecalciferol (CALCIUM 600+D3 PO) Take 1 tablet by mouth daily.     Flaxseed, Linseed, (FLAXSEED OIL) 1000 MG CAPS Take 1,000 mg by mouth daily.     hydroxypropyl methylcellulose / hypromellose (ISOPTO TEARS / GONIOVISC) 2.5 % ophthalmic solution Place 1 drop into both eyes 3 (three) times daily as needed for dry eyes.      lisinopril (ZESTRIL) 10 MG tablet Take 10 mg by mouth daily.     Multiple Vitamin (MULTIVITAMIN WITH MINERALS) TABS tablet Take 1 tablet by mouth daily. One-A-Day for Women     Omega-3 Fatty Acids (FISH OIL) 1000 MG CAPS Take 1,000 mg by mouth daily.     tamoxifen (NOLVADEX) 20 MG tablet Take 1 tablet (20 mg total) by mouth daily. 90 tablet 0   No current facility-administered medications for this visit.    PHYSICAL EXAMINATION: ECOG PERFORMANCE STATUS: 1 - Symptomatic but completely ambulatory  Vitals:   12/04/20 1139  BP: (!) 160/66  Pulse: 68  Resp: 19  Temp: 97.8 F (36.6 C)  SpO2: 99%   Filed Weights   12/04/20 1139  Weight: 142 lb 3.2 oz (64.5 kg)      LABORATORY DATA:  I have reviewed the data as listed CMP Latest Ref Rng & Units 02/04/2019 10/12/2008 09/28/2007  Glucose 70 - 99 mg/dL 90 95 99  BUN 8 - 23 mg/dL _0 Creatinine 0.44 -  1.00 mg/dL 0.60 0.84 0.96  Sodium 135 - 145 mmol/L 139 140 139  Potassium 3.5 - 5.1 mmol/L 4.0 4.3 4.3  Chloride 98 - 111 mmol/L 103 105 103  CO2 19 - 32 mEq/L - 26 27  Calcium 8.4 - 10.5 mg/dL - 8.9 9.5  Total Protein 6.0 - 8.3 g/dL - 7.0 6.8  Total Bilirubin 0.3 - 1.2 mg/dL - 0.3 0.2(L)  Alkaline Phos 39 - 117 U/L - 74 67  AST 0 - 37 U/L - 14 15  ALT 0 - 35 U/L - 11 11    Lab Results  Component Value Date   WBC 4.9 03/16/2020   HGB 14.5 03/16/2020   HCT 44.8 03/16/2020   MCV 93.3  03/16/2020   PLT 188 03/16/2020   NEUTROABS 4.4 10/12/2008    ASSESSMENT & PLAN:  Malignant neoplasm of upper-outer quadrant of left breast in female, estrogen receptor positive ([place]) 03/16/2020: Left mastectomy (Dr. 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  (Personal history of breast cancer in 1999 for which she underwent a lumpectomy) T1b N0 stage Ia  Current Treatment: Anastrozole 1 mg daily started 03/2020 changed to Tamoxifen Toxicities: Leg cramps  Hypertension: Much improved  Breast Cancer Surveillance:  10/31/20: mammogram: benign  RTC in 1 year   No orders of the defined types were placed in this encounter.  The patient has a good understanding of the overall plan. she agrees with it. she will call with any problems that may develop before the next visit here.  Total time spent: 20 mins including face to face time and time spent for planning, charting and coordination of care  Rulon Eisenmenger, MD, MPH 12/04/2020  I, Thana Ates, am acting as scribe for Dr. Nicholas Lose.  I have reviewed the above documentation for accuracy and completeness, and I agree with the above.

## 2020-12-04 ENCOUNTER — Inpatient Hospital Stay: Payer: Medicare HMO | Attending: Hematology and Oncology | Admitting: Hematology and Oncology

## 2020-12-04 ENCOUNTER — Telehealth: Payer: Self-pay | Admitting: Hematology and Oncology

## 2020-12-04 ENCOUNTER — Other Ambulatory Visit: Payer: Self-pay

## 2020-12-04 DIAGNOSIS — R252 Cramp and spasm: Secondary | ICD-10-CM | POA: Diagnosis not present

## 2020-12-04 DIAGNOSIS — Z170 Estrogen receptor positive status [ER+]: Secondary | ICD-10-CM | POA: Insufficient documentation

## 2020-12-04 DIAGNOSIS — C50412 Malignant neoplasm of upper-outer quadrant of left female breast: Secondary | ICD-10-CM | POA: Insufficient documentation

## 2020-12-04 DIAGNOSIS — Z9012 Acquired absence of left breast and nipple: Secondary | ICD-10-CM | POA: Insufficient documentation

## 2020-12-04 DIAGNOSIS — Z79899 Other long term (current) drug therapy: Secondary | ICD-10-CM | POA: Diagnosis not present

## 2020-12-04 DIAGNOSIS — Z79810 Long term (current) use of selective estrogen receptor modulators (SERMs): Secondary | ICD-10-CM | POA: Diagnosis not present

## 2020-12-04 DIAGNOSIS — I10 Essential (primary) hypertension: Secondary | ICD-10-CM | POA: Insufficient documentation

## 2020-12-04 NOTE — Telephone Encounter (Signed)
Scheduled appointment per 07/18 los. Patient is aware.

## 2020-12-04 NOTE — Assessment & Plan Note (Signed)
03/16/2020: Left mastectomy (Dr. 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  (Personal history of breast cancer in 1999 for which she underwent a lumpectomy) T1b N0 stage Ia  Current Treatment: Anastrozole 1 mg daily started 03/2020 changed to Tamoxifen Toxicities:  Hypertension:  Breast Cancer Surveillance:  10/31/20: mammogram: benign  RTC in 1 year RTC in 1 year

## 2020-12-11 ENCOUNTER — Ambulatory Visit: Payer: Medicare HMO | Admitting: Hematology and Oncology

## 2020-12-20 DIAGNOSIS — N644 Mastodynia: Secondary | ICD-10-CM | POA: Diagnosis not present

## 2020-12-20 DIAGNOSIS — E785 Hyperlipidemia, unspecified: Secondary | ICD-10-CM | POA: Diagnosis not present

## 2020-12-20 DIAGNOSIS — I10 Essential (primary) hypertension: Secondary | ICD-10-CM | POA: Diagnosis not present

## 2020-12-27 DIAGNOSIS — H43811 Vitreous degeneration, right eye: Secondary | ICD-10-CM | POA: Diagnosis not present

## 2020-12-27 DIAGNOSIS — H26491 Other secondary cataract, right eye: Secondary | ICD-10-CM | POA: Diagnosis not present

## 2020-12-27 DIAGNOSIS — H35371 Puckering of macula, right eye: Secondary | ICD-10-CM | POA: Diagnosis not present

## 2020-12-27 DIAGNOSIS — H04123 Dry eye syndrome of bilateral lacrimal glands: Secondary | ICD-10-CM | POA: Diagnosis not present

## 2021-01-02 DIAGNOSIS — R0981 Nasal congestion: Secondary | ICD-10-CM | POA: Diagnosis not present

## 2021-01-02 DIAGNOSIS — Z20828 Contact with and (suspected) exposure to other viral communicable diseases: Secondary | ICD-10-CM | POA: Diagnosis not present

## 2021-01-02 DIAGNOSIS — R509 Fever, unspecified: Secondary | ICD-10-CM | POA: Diagnosis not present

## 2021-01-02 DIAGNOSIS — R059 Cough, unspecified: Secondary | ICD-10-CM | POA: Diagnosis not present

## 2021-01-19 ENCOUNTER — Encounter: Payer: Self-pay | Admitting: Plastic Surgery

## 2021-01-19 ENCOUNTER — Other Ambulatory Visit: Payer: Self-pay

## 2021-01-19 ENCOUNTER — Other Ambulatory Visit: Payer: Self-pay | Admitting: Hematology and Oncology

## 2021-01-19 ENCOUNTER — Ambulatory Visit (INDEPENDENT_AMBULATORY_CARE_PROVIDER_SITE_OTHER): Payer: Medicare HMO | Admitting: Plastic Surgery

## 2021-01-19 VITALS — BP 176/85 | HR 65 | Temp 98.1°F | Ht 59.0 in | Wt 136.0 lb

## 2021-01-19 DIAGNOSIS — L905 Scar conditions and fibrosis of skin: Secondary | ICD-10-CM

## 2021-01-19 DIAGNOSIS — N6489 Other specified disorders of breast: Secondary | ICD-10-CM

## 2021-01-19 DIAGNOSIS — Z9012 Acquired absence of left breast and nipple: Secondary | ICD-10-CM | POA: Diagnosis not present

## 2021-01-19 NOTE — Progress Notes (Signed)
   Subjective:    Patient ID: Sarah Barrett, female    DOB: Dec 11, 1940, 80 y.o.   MRN: [id number]  The patient is an 80 year old female here for evaluation of her shoulder and left breast.  She had a lipoma excised from her right shoulder about 2 years ago by general surgery.  She also had a left-sided mastectomy last year.  She is not happy with the way that the shoulder scar looks but mostly is uncomfortable with the feeling of it being itchy and pulling.  She is wondering if something could be done to help her to feel better.  She also has some fullness on the medial aspect of the left breast.  She is wondering if this could be revised.  She is otherwise doing well and it was great to see her.     Review of Systems  Constitutional: Negative.   Eyes: Negative.   Respiratory: Negative.    Cardiovascular: Negative.   Gastrointestinal: Negative.   Endocrine: Negative.   Genitourinary: Negative.   Musculoskeletal: Negative.   Hematological: Negative.   Psychiatric/Behavioral: Negative.        Objective:   Physical Exam Vitals and nursing note reviewed.  Constitutional:      Appearance: Normal appearance.  Cardiovascular:     Rate and Rhythm: Normal rate.     Pulses: Normal pulses.  Pulmonary:     Effort: Pulmonary effort is normal.  Musculoskeletal:       Arms:  Skin:    Capillary Refill: Capillary refill takes less than 2 seconds.     Coloration: Skin is not jaundiced.     Findings: No bruising or lesion.  Neurological:     Mental Status: She is alert. Mental status is at baseline.  Psychiatric:        Mood and Affect: Mood normal.        Behavior: Behavior normal.       Assessment & Plan:     ICD-10-CM   1. S/P mastectomy, left  Z90.12     2. Postoperative breast asymmetry  N64.89     3. Scar  L90.5       Recommendation for the right shoulder scar is massage and Mederma.  I would like to see how she does with that and if it improves.  Then if we need to we can  revise the right scar and do a reconstruction for flattening out in the left breast at the same time.  This may help it to the feel a little bit better and her close to fit a little bit better.  The patient is happy with this idea and will follow up in 2 months.

## 2021-02-15 DIAGNOSIS — C50412 Malignant neoplasm of upper-outer quadrant of left female breast: Secondary | ICD-10-CM | POA: Diagnosis not present

## 2021-02-15 DIAGNOSIS — Z170 Estrogen receptor positive status [ER+]: Secondary | ICD-10-CM | POA: Diagnosis not present

## 2021-02-27 DIAGNOSIS — Z23 Encounter for immunization: Secondary | ICD-10-CM | POA: Diagnosis not present

## 2021-04-17 DIAGNOSIS — C50412 Malignant neoplasm of upper-outer quadrant of left female breast: Secondary | ICD-10-CM | POA: Diagnosis not present

## 2021-04-17 DIAGNOSIS — Z170 Estrogen receptor positive status [ER+]: Secondary | ICD-10-CM | POA: Diagnosis not present

## 2021-05-01 ENCOUNTER — Ambulatory Visit: Payer: Medicare HMO | Admitting: Plastic Surgery

## 2021-05-01 ENCOUNTER — Other Ambulatory Visit: Payer: Self-pay

## 2021-05-01 DIAGNOSIS — L905 Scar conditions and fibrosis of skin: Secondary | ICD-10-CM

## 2021-05-01 NOTE — Progress Notes (Signed)
° °  Subjective:    Patient ID: Sarah Barrett, female    DOB: 09/06/1940, 80 y.o.   MRN: [id number]  Sarah Barrett is a 80 year old female here for follow-up on her right shoulder scar from an excision.  The skin has healed and overall looks good.  She has been putting mederma on the area.  There is a little bit of scar tissue that may be contributing to the itching and pulling sensation.  No sign of infection.     Review of Systems  Constitutional: Negative.   HENT: Negative.    Eyes: Negative.   Respiratory: Negative.    Cardiovascular: Negative.   Gastrointestinal: Negative.   Endocrine: Negative.   Genitourinary: Negative.   Skin: Negative.   Neurological:  Positive for facial asymmetry.      Objective:   Physical Exam Constitutional:      Appearance: Normal appearance.  HENT:     Head: Normocephalic and atraumatic.  Cardiovascular:     Rate and Rhythm: Normal rate.     Pulses: Normal pulses.  Pulmonary:     Effort: Pulmonary effort is normal.  Musculoskeletal:        General: No swelling or deformity.       Arms:  Skin:    General: Skin is warm.     Capillary Refill: Capillary refill takes less than 2 seconds.     Coloration: Skin is not jaundiced.     Findings: No bruising or lesion.  Neurological:     Mental Status: She is alert and oriented to person, place, and time.  Psychiatric:        Mood and Affect: Mood normal.        Behavior: Behavior normal.        Thought Content: Thought content normal.        Assessment & Plan:     ICD-10-CM   1. Scar  L90.5       Kenalog 50/5 0.2 cc mixed with lidocaine with epi 0.1 cc.  The right should scar was injected.  Recommend baby oil gel for the dry skin. Follow up in 3-6 months.

## 2021-06-05 ENCOUNTER — Other Ambulatory Visit: Payer: Self-pay | Admitting: *Deleted

## 2021-06-05 DIAGNOSIS — C50412 Malignant neoplasm of upper-outer quadrant of left female breast: Secondary | ICD-10-CM

## 2021-06-05 DIAGNOSIS — Z170 Estrogen receptor positive status [ER+]: Secondary | ICD-10-CM

## 2021-06-05 NOTE — Progress Notes (Signed)
Patient Care Team: Kelton Pillar, MD as PCP - General (Family Medicine) Nicholas Lose, MD as Consulting Physician (Hematology and Oncology) Rolm Bookbinder, MD as Consulting Physician (General Surgery)  DIAGNOSIS:    ICD-10-CM   1. Malignant neoplasm of upper-outer quadrant of left breast in female, estrogen receptor positive ([place])  C50.412    Z17.0       SUMMARY OF ONCOLOGIC HISTORY: Oncology History  Malignant neoplasm of upper-outer quadrant of left breast in female, estrogen receptor positive ([place])  02/21/2020 Initial Diagnosis   Patient presented to PCP with swollen, erythematous left breast and an inverted nipple. Mammogram and [place] on 02/01/20 showed two masses at the 2 o'clock position, 1.7cm and 0.7cm. Biopsy on 02/21/20 showed invasive ductal carcinoma, grade 2, HER-2 negative (1+), ER/PR+ 95%, KI67 15%.    02/21/2020 Cancer Staging   Staging form: Breast, AJCC 8th Edition - Clinical stage from 02/21/2020: Stage IA (cT1c, cN0, cM0, G2, ER+, PR+, HER2-) Stage prefix: Initial diagnosis    03/16/2020 Surgery   Left mastectomy (Dr. Donne Hazel) [phone]): Multifocal invasive ductal carcinoma and DCIS, grade 2, 0.9cm, clear margins ER/PR 95% positive, HER-2 negative, Ki-67 15%.  (Personal history of breast cancer in 1999 for which she underwent a lumpectomy)   03/16/2020 Cancer Staging   Staging form: Breast, AJCC 8th Edition - Pathologic stage from 03/16/2020: Stage IA (pT1b, pN0, cM0, G2, ER+, PR+, HER2-) Histologic grading system: 3 grade system    03/2020 -  Anti-estrogen oral therapy   Anastrozole x 1 month, changed to Tamoxifen due to BP issues     CHIEF COMPLIANT: Follow-up of left breast cancer  INTERVAL HISTORY: Sarah Barrett is a 81 y.o. with above-mentioned history of left breast cancer having undergone a left mastectomy, currently on antiestrogen therapy with tamoxifen. She presents to the clinic today for follow-up. Her most recent mammogram was completed  in 10/2020 and showed no evidence of malignancy and breast density category B.  She is tolerating the Tamoxifen well.  She denies hot flashes, vaginal discharge, swelling, arthralgias, or any other concerns.   She has a h/o nodularity at her left mastectomy site that she applies cream to.  This was determined to be scar tissue and has improved greatly.  She wants me to look at the scar line and evaluate for any changes.    ALLERGIES:  is allergic to ace inhibitors, morphine and related, and prednisone.  MEDICATIONS:  Current Outpatient Medications  Medication Sig Dispense Refill   Calcium Carb-Cholecalciferol (CALCIUM 600+D3 PO) Take 1 tablet by mouth daily.     Flaxseed, Linseed, (FLAXSEED OIL) 1000 MG CAPS Take 1,000 mg by mouth daily.     Multiple Vitamin (MULTIVITAMIN WITH MINERALS) TABS tablet Take 1 tablet by mouth daily. One-A-Day for Women     olmesartan (BENICAR) 20 MG tablet Take 20 mg by mouth daily.     Omega-3 Fatty Acids (FISH OIL) 1000 MG CAPS Take 1,000 mg by mouth daily.     tamoxifen (NOLVADEX) 20 MG tablet TAKE 1 TABLET EVERY DAY 90 tablet 0   No current facility-administered medications for this visit.    PHYSICAL EXAMINATION: ECOG PERFORMANCE STATUS: 1 - Symptomatic but completely ambulatory  Vitals:   06/06/21 1151  BP: (!) 162/68  Pulse: 67  Resp: 18  Temp: 98.2 F (36.8 C)  SpO2: 100%   Filed Weights   06/06/21 1151  Weight: 141 lb (64 kg)    BREAST: left bresat s/p mastectomy, no sign of local recurrence.  There is scar tissue noted along the mastectomy scar.  No palpable masses or nodules in either right breasts. No palpable axillary supraclavicular or infraclavicular adenopathy no breast tenderness or nipple discharge. (exam performed in the presence of a chaperone)  LABORATORY DATA:  I have reviewed the data as listed CMP Latest Ref Rng & Units 06/06/2021 02/04/2019 10/12/2008  Glucose 70 - 99 mg/dL 100(H) 90 95  BUN 8 - 23 mg/dL '12 15 20   ' Creatinine 0.44 - 1.00 mg/dL 0.67 0.60 0.84  Sodium 135 - 145 mmol/L 133(L) 139 140  Potassium 3.5 - 5.1 mmol/L 4.0 4.0 4.3  Chloride 98 - 111 mmol/L 101 103 105  CO2 22 - 32 mmol/L 29 - 26  Calcium 8.9 - 10.3 mg/dL 9.5 - 8.9  Total Protein 6.5 - 8.1 g/dL 6.9 - 7.0  Total Bilirubin 0.3 - 1.2 mg/dL 0.3 - 0.3  Alkaline Phos 38 - 126 U/L 28(L) - 74  AST 15 - 41 U/L 19 - 14  ALT 0 - 44 U/L 18 - 11    Lab Results  Component Value Date   WBC 5.5 06/06/2021   HGB 12.9 06/06/2021   HCT 39.4 06/06/2021   MCV 90.2 06/06/2021   PLT 148 (L) 06/06/2021   NEUTROABS 2.7 06/06/2021    ASSESSMENT & PLAN:  Malignant neoplasm of upper-outer quadrant of left breast in female, estrogen receptor positive ([place]) 03/16/2020: Left mastectomy (Dr. 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  (Personal history of breast cancer in 1999 for which she underwent a lumpectomy) T1b N0 stage Ia   Current Treatment: Anastrozole 1 mg daily started 03/2020 changed to Tamoxifen Toxicities: Leg cramps   Hypertension: Much improved   Breast Cancer Surveillance:  10/31/20: mammogram: benign  06/06/2021: Breast exam: Benign  Patient is complaining of swelling along the surgical scar that she felt was like a nodule.  Previously physical therapy was done to that area and it has improved significantly.  RTC in 1 year    No orders of the defined types were placed in this encounter.  The patient has a good understanding of the overall plan. she agrees with it. she will call with any problems that may develop before the next visit here.  Total time spent: 20 mins including face to face time and time spent for planning, charting and coordination of care  Rulon Eisenmenger, MD, MPH 06/06/2021  I, Thana Ates, am acting as scribe for Dr. Nicholas Lose.  I have reviewed the above documentation for accuracy and completeness, and I  agree with the above.

## 2021-06-06 ENCOUNTER — Inpatient Hospital Stay: Payer: Medicare HMO | Admitting: Hematology and Oncology

## 2021-06-06 ENCOUNTER — Encounter: Payer: Self-pay | Admitting: Hematology and Oncology

## 2021-06-06 ENCOUNTER — Inpatient Hospital Stay: Payer: Medicare HMO | Attending: Hematology and Oncology

## 2021-06-06 ENCOUNTER — Other Ambulatory Visit: Payer: Self-pay

## 2021-06-06 DIAGNOSIS — Z170 Estrogen receptor positive status [ER+]: Secondary | ICD-10-CM | POA: Insufficient documentation

## 2021-06-06 DIAGNOSIS — C50412 Malignant neoplasm of upper-outer quadrant of left female breast: Secondary | ICD-10-CM

## 2021-06-06 DIAGNOSIS — Z9012 Acquired absence of left breast and nipple: Secondary | ICD-10-CM | POA: Diagnosis not present

## 2021-06-06 DIAGNOSIS — I10 Essential (primary) hypertension: Secondary | ICD-10-CM | POA: Diagnosis not present

## 2021-06-06 DIAGNOSIS — Z79811 Long term (current) use of aromatase inhibitors: Secondary | ICD-10-CM | POA: Diagnosis not present

## 2021-06-06 DIAGNOSIS — R252 Cramp and spasm: Secondary | ICD-10-CM | POA: Diagnosis not present

## 2021-06-06 DIAGNOSIS — E785 Hyperlipidemia, unspecified: Secondary | ICD-10-CM | POA: Insufficient documentation

## 2021-06-06 LAB — CBC WITH DIFFERENTIAL (CANCER CENTER ONLY)
Abs Immature Granulocytes: 0.1 10*3/uL — ABNORMAL HIGH (ref 0.00–0.07)
Basophils Absolute: 0.1 10*3/uL (ref 0.0–0.1)
Basophils Relative: 1 %
Eosinophils Absolute: 0.1 10*3/uL (ref 0.0–0.5)
Eosinophils Relative: 1 %
HCT: 39.4 % (ref 36.0–46.0)
Hemoglobin: 12.9 g/dL (ref 12.0–15.0)
Lymphocytes Relative: 38 %
Lymphs Abs: 2.1 10*3/uL (ref 0.7–4.0)
MCH: 29.5 pg (ref 26.0–34.0)
MCHC: 32.7 g/dL (ref 30.0–36.0)
MCV: 90.2 fL (ref 80.0–100.0)
Metamyelocytes Relative: 1 %
Monocytes Absolute: 0.6 10*3/uL (ref 0.1–1.0)
Monocytes Relative: 10 %
Neutro Abs: 2.7 10*3/uL (ref 1.7–7.7)
Neutrophils Relative %: 49 %
Platelet Count: 148 10*3/uL — ABNORMAL LOW (ref 150–400)
RBC: 4.37 MIL/uL (ref 3.87–5.11)
RDW: 13.6 % (ref 11.5–15.5)
Smear Review: NORMAL
WBC Count: 5.5 10*3/uL (ref 4.0–10.5)
nRBC: 0 % (ref 0.0–0.2)

## 2021-06-06 LAB — CMP (CANCER CENTER ONLY)
ALT: 18 U/L (ref 0–44)
AST: 19 U/L (ref 15–41)
Albumin: 3.6 g/dL (ref 3.5–5.0)
Alkaline Phosphatase: 28 U/L — ABNORMAL LOW (ref 38–126)
Anion gap: 3 — ABNORMAL LOW (ref 5–15)
BUN: 12 mg/dL (ref 8–23)
CO2: 29 mmol/L (ref 22–32)
Calcium: 9.5 mg/dL (ref 8.9–10.3)
Chloride: 101 mmol/L (ref 98–111)
Creatinine: 0.67 mg/dL (ref 0.44–1.00)
GFR, Estimated: >60 mL/min (ref >60)
Glucose, Bld: 100 mg/dL — ABNORMAL HIGH (ref 70–99)
Potassium: 4 mmol/L (ref 3.5–5.1)
Sodium: 133 mmol/L — ABNORMAL LOW (ref 135–145)
Total Bilirubin: 0.3 mg/dL (ref 0.3–1.2)
Total Protein: 6.9 g/dL (ref 6.5–8.1)

## 2021-06-06 NOTE — Assessment & Plan Note (Signed)
03/16/2020: Left mastectomy (Dr. 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 (Personal history of breast cancer in 1999 for which she underwent a lumpectomy)T1b N0 stage Ia  Current Treatment: Anastrozole 1 mg daily started 03/2020 changed to Tamoxifen Toxicities: Leg cramps  Hypertension: Much improved  Breast Cancer Surveillance:  10/31/20: mammogram: benign   RTC in 1 year

## 2021-06-15 ENCOUNTER — Ambulatory Visit: Payer: Medicare HMO | Admitting: Plastic Surgery

## 2021-06-17 ENCOUNTER — Other Ambulatory Visit: Payer: Self-pay | Admitting: Hematology and Oncology

## 2021-06-22 DIAGNOSIS — M7061 Trochanteric bursitis, right hip: Secondary | ICD-10-CM | POA: Diagnosis not present

## 2021-06-22 DIAGNOSIS — M25551 Pain in right hip: Secondary | ICD-10-CM | POA: Diagnosis not present

## 2021-06-27 DIAGNOSIS — M7061 Trochanteric bursitis, right hip: Secondary | ICD-10-CM | POA: Diagnosis not present

## 2021-07-03 DIAGNOSIS — Z0000 Encounter for general adult medical examination without abnormal findings: Secondary | ICD-10-CM | POA: Diagnosis not present

## 2021-07-03 DIAGNOSIS — Z853 Personal history of malignant neoplasm of breast: Secondary | ICD-10-CM | POA: Diagnosis not present

## 2021-07-03 DIAGNOSIS — C50919 Malignant neoplasm of unspecified site of unspecified female breast: Secondary | ICD-10-CM | POA: Diagnosis not present

## 2021-07-03 DIAGNOSIS — I10 Essential (primary) hypertension: Secondary | ICD-10-CM | POA: Diagnosis not present

## 2021-07-03 DIAGNOSIS — E785 Hyperlipidemia, unspecified: Secondary | ICD-10-CM | POA: Diagnosis not present

## 2021-07-03 DIAGNOSIS — Z1389 Encounter for screening for other disorder: Secondary | ICD-10-CM | POA: Diagnosis not present

## 2021-08-15 DIAGNOSIS — C50412 Malignant neoplasm of upper-outer quadrant of left female breast: Secondary | ICD-10-CM | POA: Diagnosis not present

## 2021-08-15 DIAGNOSIS — C50911 Malignant neoplasm of unspecified site of right female breast: Secondary | ICD-10-CM | POA: Diagnosis not present

## 2021-09-03 DIAGNOSIS — H903 Sensorineural hearing loss, bilateral: Secondary | ICD-10-CM | POA: Diagnosis not present

## 2021-09-03 DIAGNOSIS — H6123 Impacted cerumen, bilateral: Secondary | ICD-10-CM | POA: Diagnosis not present

## 2021-09-17 DIAGNOSIS — M5441 Lumbago with sciatica, right side: Secondary | ICD-10-CM | POA: Diagnosis not present

## 2021-09-17 DIAGNOSIS — M5451 Vertebrogenic low back pain: Secondary | ICD-10-CM | POA: Diagnosis not present

## 2021-09-17 DIAGNOSIS — M545 Low back pain, unspecified: Secondary | ICD-10-CM | POA: Diagnosis not present

## 2021-09-17 DIAGNOSIS — M5442 Lumbago with sciatica, left side: Secondary | ICD-10-CM | POA: Diagnosis not present

## 2021-11-08 DIAGNOSIS — Z1231 Encounter for screening mammogram for malignant neoplasm of breast: Secondary | ICD-10-CM | POA: Diagnosis not present

## 2021-12-06 DIAGNOSIS — C50412 Malignant neoplasm of upper-outer quadrant of left female breast: Secondary | ICD-10-CM | POA: Diagnosis not present

## 2021-12-06 DIAGNOSIS — Z170 Estrogen receptor positive status [ER+]: Secondary | ICD-10-CM | POA: Diagnosis not present

## 2021-12-24 DIAGNOSIS — M5451 Vertebrogenic low back pain: Secondary | ICD-10-CM | POA: Diagnosis not present

## 2021-12-24 DIAGNOSIS — M4326 Fusion of spine, lumbar region: Secondary | ICD-10-CM | POA: Diagnosis not present

## 2021-12-27 ENCOUNTER — Other Ambulatory Visit: Payer: Self-pay | Admitting: Family Medicine

## 2021-12-27 DIAGNOSIS — M545 Low back pain, unspecified: Secondary | ICD-10-CM

## 2021-12-28 DIAGNOSIS — M545 Low back pain, unspecified: Secondary | ICD-10-CM | POA: Diagnosis not present

## 2021-12-31 DIAGNOSIS — E785 Hyperlipidemia, unspecified: Secondary | ICD-10-CM | POA: Diagnosis not present

## 2021-12-31 DIAGNOSIS — I10 Essential (primary) hypertension: Secondary | ICD-10-CM | POA: Diagnosis not present

## 2021-12-31 DIAGNOSIS — Z853 Personal history of malignant neoplasm of breast: Secondary | ICD-10-CM | POA: Diagnosis not present

## 2021-12-31 DIAGNOSIS — M545 Low back pain, unspecified: Secondary | ICD-10-CM | POA: Diagnosis not present

## 2022-01-02 DIAGNOSIS — H35371 Puckering of macula, right eye: Secondary | ICD-10-CM | POA: Diagnosis not present

## 2022-01-02 DIAGNOSIS — H04123 Dry eye syndrome of bilateral lacrimal glands: Secondary | ICD-10-CM | POA: Diagnosis not present

## 2022-01-02 DIAGNOSIS — H43811 Vitreous degeneration, right eye: Secondary | ICD-10-CM | POA: Diagnosis not present

## 2022-01-02 DIAGNOSIS — H26491 Other secondary cataract, right eye: Secondary | ICD-10-CM | POA: Diagnosis not present

## 2022-01-04 ENCOUNTER — Ambulatory Visit
Admission: RE | Admit: 2022-01-04 | Discharge: 2022-01-04 | Disposition: A | Discharge status: Home / Self Care | Payer: Medicare HMO | Source: Ambulatory Visit | Attending: Family Medicine | Admitting: Family Medicine

## 2022-01-04 DIAGNOSIS — M545 Low back pain, unspecified: Secondary | ICD-10-CM

## 2022-01-08 DIAGNOSIS — M5451 Vertebrogenic low back pain: Secondary | ICD-10-CM | POA: Diagnosis not present

## 2022-01-08 DIAGNOSIS — M4326 Fusion of spine, lumbar region: Secondary | ICD-10-CM | POA: Diagnosis not present

## 2022-01-14 DIAGNOSIS — M47816 Spondylosis without myelopathy or radiculopathy, lumbar region: Secondary | ICD-10-CM | POA: Diagnosis not present

## 2022-01-18 ENCOUNTER — Other Ambulatory Visit: Payer: Medicare HMO

## 2022-01-23 DIAGNOSIS — M47816 Spondylosis without myelopathy or radiculopathy, lumbar region: Secondary | ICD-10-CM | POA: Diagnosis not present

## 2022-02-25 DIAGNOSIS — M47816 Spondylosis without myelopathy or radiculopathy, lumbar region: Secondary | ICD-10-CM | POA: Diagnosis not present

## 2022-03-07 DIAGNOSIS — H6123 Impacted cerumen, bilateral: Secondary | ICD-10-CM | POA: Diagnosis not present

## 2022-03-07 DIAGNOSIS — H903 Sensorineural hearing loss, bilateral: Secondary | ICD-10-CM | POA: Diagnosis not present

## 2022-03-09 DIAGNOSIS — Z23 Encounter for immunization: Secondary | ICD-10-CM | POA: Diagnosis not present

## 2022-03-19 ENCOUNTER — Encounter: Payer: Self-pay | Admitting: Plastic Surgery

## 2022-03-19 ENCOUNTER — Ambulatory Visit: Payer: Medicare HMO | Admitting: Plastic Surgery

## 2022-03-19 DIAGNOSIS — Z170 Estrogen receptor positive status [ER+]: Secondary | ICD-10-CM

## 2022-03-19 DIAGNOSIS — M79601 Pain in right arm: Secondary | ICD-10-CM | POA: Insufficient documentation

## 2022-03-19 DIAGNOSIS — C50412 Malignant neoplasm of upper-outer quadrant of left female breast: Secondary | ICD-10-CM | POA: Diagnosis not present

## 2022-03-19 DIAGNOSIS — Z923 Personal history of irradiation: Secondary | ICD-10-CM

## 2022-03-19 NOTE — Progress Notes (Signed)
   Subjective:    Patient ID: Sarah Barrett, female    DOB: 02-Jul-1940, 81 y.o.   MRN: [id number]  The patient is an 81 year old female well-known to me for having seen her for different things.  She is here today because she is aggravated about her left breast.  She has a little excess tissue that is really aggravating her with her prosthetic and her bra.  She had radiation on both breasts and was treated within the last couple years for left breast cancer.  She had a mass removed from her right shoulder and as a result has a scar there.  She is concerned that there may still be something still there because it itches really bad.  She has tried cocoa butter and Johnson's baby oil.  None of that seems to have helped.  I do not feel anything but cannot be certain that there is not something there.    Review of Systems  Constitutional: Negative.   Eyes: Negative.   Respiratory: Negative.  Negative for chest tightness and shortness of breath.   Cardiovascular: Negative.   Gastrointestinal: Negative.   Endocrine: Negative.   Genitourinary: Negative.   Musculoskeletal: Negative.   Neurological: Negative.   Hematological: Negative.        Objective:   Physical Exam Constitutional:      Appearance: Normal appearance.  Cardiovascular:     Rate and Rhythm: Normal rate.     Pulses: Normal pulses.  Pulmonary:     Effort: Pulmonary effort is normal.  Abdominal:     General: There is no distension.     Palpations: Abdomen is soft.  Musculoskeletal:        General: Tenderness present. No swelling, deformity or signs of injury.  Skin:    General: Skin is warm.     Coloration: Skin is not jaundiced or pale.     Findings: No bruising, erythema, lesion or rash.  Neurological:     Mental Status: She is alert and oriented to person, place, and time.  Psychiatric:        Mood and Affect: Mood normal.        Behavior: Behavior normal.        Thought Content: Thought content normal.         Judgment: Judgment normal.        Assessment & Plan:     ICD-10-CM   1. Malignant neoplasm of upper-outer quadrant of left breast in female, estrogen receptor positive ([place])  C50.412    Z17.0     2. Arm pain, lateral, right  M79.601     Will order an ultrasound for further evaluation of her right arm.  Recommend surgery for excision of excess breast tissue left breast.  I stressed we have to be very careful because of her history of radiation.  Pictures were obtained of the patient and placed in the chart with the patient's or guardian's permission.

## 2022-03-28 ENCOUNTER — Ambulatory Visit (HOSPITAL_BASED_OUTPATIENT_CLINIC_OR_DEPARTMENT_OTHER)
Admission: RE | Admit: 2022-03-28 | Discharge: 2022-03-28 | Disposition: A | Discharge status: Home / Self Care | Payer: Medicare HMO | Source: Ambulatory Visit | Attending: Plastic Surgery | Admitting: Plastic Surgery

## 2022-03-28 DIAGNOSIS — M79601 Pain in right arm: Secondary | ICD-10-CM | POA: Insufficient documentation

## 2022-03-28 DIAGNOSIS — Z170 Estrogen receptor positive status [ER+]: Secondary | ICD-10-CM | POA: Insufficient documentation

## 2022-03-28 DIAGNOSIS — C50412 Malignant neoplasm of upper-outer quadrant of left female breast: Secondary | ICD-10-CM | POA: Diagnosis not present

## 2022-03-28 DIAGNOSIS — Z0389 Encounter for observation for other suspected diseases and conditions ruled out: Secondary | ICD-10-CM | POA: Diagnosis not present

## 2022-04-02 DIAGNOSIS — H903 Sensorineural hearing loss, bilateral: Secondary | ICD-10-CM | POA: Diagnosis not present

## 2022-04-05 ENCOUNTER — Telehealth: Payer: Self-pay | Admitting: *Deleted

## 2022-04-05 NOTE — Telephone Encounter (Signed)
Called and spoke with the patient and informed her of her recent US results below.  Patient verbalized understanding and agreed.  Patient asked if she was to keep her next appointment with Dr. Marla Roe, and I informed her yes she's to keep the appointment.  Patient verbalized understanding and agreed.//AB/CMA

## 2022-04-05 NOTE — Telephone Encounter (Signed)
-----   Message from Wallace Going, DO sent at 04/02/2022 11:46 AM EST ----- Please let pt know the U/S looked good. ----- Message ----- From: Interface, Rad Results In Sent: 04/01/2022   9:35 AM EST To: Loel Lofty Dillingham, DO

## 2022-04-16 ENCOUNTER — Encounter: Payer: Self-pay | Admitting: Plastic Surgery

## 2022-04-16 ENCOUNTER — Ambulatory Visit: Payer: Medicare HMO | Admitting: Plastic Surgery

## 2022-04-16 VITALS — BP 174/92 | HR 82

## 2022-04-16 DIAGNOSIS — C50412 Malignant neoplasm of upper-outer quadrant of left female breast: Secondary | ICD-10-CM | POA: Diagnosis not present

## 2022-04-16 DIAGNOSIS — Z923 Personal history of irradiation: Secondary | ICD-10-CM

## 2022-04-16 DIAGNOSIS — N6489 Other specified disorders of breast: Secondary | ICD-10-CM

## 2022-04-16 DIAGNOSIS — Z170 Estrogen receptor positive status [ER+]: Secondary | ICD-10-CM

## 2022-04-16 DIAGNOSIS — N651 Disproportion of reconstructed breast: Secondary | ICD-10-CM

## 2022-04-16 NOTE — Progress Notes (Signed)
   Subjective:    Patient ID: Sarah Barrett, female    DOB: Oct 29, 1940, 81 y.o.   MRN: [id number]  The patient is a 81 year old female here for evaluation of her left breast.  She had a left mastectomy October 2021.  She is unhappy with the contour of the left breast and would like to have it smooth it out so it is not so irritating when she wears a prosthetic.  Pictures are in the chart.  I see what she is concerned about and think that we could achieve nice result with minimal disruption.  Since she had the radiation we need to be very careful.  She was radiated on both sides.  Most concerning areas on the medial aspect of the left breast.  I also looked at the scar of her right arm.  The ultrasound was negative.  The patient still has some itching.  I like her to try the scar cream we have and see if that helps to calm it down.    Review of Systems  Constitutional: Negative.   Eyes: Negative.   Respiratory: Negative.    Cardiovascular: Negative.   Gastrointestinal: Negative.   Endocrine: Negative.   Genitourinary: Negative.   Musculoskeletal: Negative.        Objective:   Physical Exam Vitals reviewed.  Constitutional:      Appearance: Normal appearance.  Cardiovascular:     Rate and Rhythm: Normal rate.     Pulses: Normal pulses.  Pulmonary:     Effort: Pulmonary effort is normal.  Musculoskeletal:        General: No swelling or deformity.  Skin:    Capillary Refill: Capillary refill takes less than 2 seconds.     Coloration: Skin is not jaundiced.     Findings: No bruising or lesion.  Neurological:     Mental Status: She is alert and oriented to person, place, and time.  Psychiatric:        Mood and Affect: Mood normal.        Behavior: Behavior normal.        Thought Content: Thought content normal.        Judgment: Judgment normal.         Assessment & Plan:     ICD-10-CM   1. Malignant neoplasm of upper-outer quadrant of left breast in female, estrogen receptor  positive ([place])  C50.412    Z17.0     2. Postoperative breast asymmetry  N64.89       Plan for excision and liposuction left breast excess issue for symmetry.

## 2022-04-24 ENCOUNTER — Telehealth: Payer: Self-pay | Admitting: Plastic Surgery

## 2022-04-24 NOTE — Telephone Encounter (Signed)
Pt called checking on surgery scheduling and would like a call back for an update.

## 2022-04-25 ENCOUNTER — Telehealth: Payer: Self-pay | Admitting: *Deleted

## 2022-04-25 NOTE — Telephone Encounter (Signed)
Started case with [employer] and notified patient that case is pending

## 2022-05-21 ENCOUNTER — Ambulatory Visit (INDEPENDENT_AMBULATORY_CARE_PROVIDER_SITE_OTHER): Payer: Medicare HMO | Admitting: Surgical

## 2022-05-21 ENCOUNTER — Encounter: Payer: Self-pay | Admitting: Surgical

## 2022-05-21 ENCOUNTER — Telehealth: Payer: Self-pay

## 2022-05-21 VITALS — BP 195/81 | HR 84

## 2022-05-21 DIAGNOSIS — Z170 Estrogen receptor positive status [ER+]: Secondary | ICD-10-CM

## 2022-05-21 DIAGNOSIS — N6489 Other specified disorders of breast: Secondary | ICD-10-CM

## 2022-05-21 DIAGNOSIS — C50412 Malignant neoplasm of upper-outer quadrant of left female breast: Secondary | ICD-10-CM

## 2022-05-21 DIAGNOSIS — Z9012 Acquired absence of left breast and nipple: Secondary | ICD-10-CM

## 2022-05-21 NOTE — Progress Notes (Signed)
Patient ID: Sarah Barrett, female    DOB: 08/15/1940, 82 y.o.   MRN: [id number]  Chief Complaint  Patient presents with   Pre-op Exam      ICD-10-CM   1. Malignant neoplasm of upper-outer quadrant of left breast in female, estrogen receptor positive ([place])  C50.412    Z17.0     2. Postoperative breast asymmetry  N64.89     3. S/P mastectomy, left  Z90.12        History of Present Illness: Sarah Barrett is a 82 y.o.  female  with a history of left mastectomy October 2021.  She presents for preoperative evaluation for upcoming procedure, excision and liposuction of left breast excess tissue for improved symmetry, scheduled for 05/30/2022 with Dr. Marla Roe.  The patient has not had problems with anesthesia. No history of DVT/PE.  No family history of DVT/PE.  No family or personal history of bleeding or clotting disorders.  Patient is not currently taking any blood thinners.  No history of CVA/MI.   Summary of Previous Visit: Patient had left mastectomy October 2021, unhappy with the contour of the left breast and would like to have it smoothed out so it is not so irritating when she is wearing her prosthetic.  She does have a history of radiation on both sides.  PMH Significant for: Bilateral breast cancer, bilateral breast radiation, left breast mastectomy  Patient is on tamoxifen, we will request for patient to be on hold this prior to surgery and 2 weeks postoperatively to decrease risk of VTE.  Patient reports she has been feeling well lately, no changes to her health.  Denies any infectious symptoms.  She reports no issues in the past with surgery.   Past Medical History: Allergies: Allergies  Allergen Reactions   Ace Inhibitors Cough    Other reaction(s): cough   Morphine And Related Other (See Comments)    Hallucinations, doesn't like how it made her feel   Prednisone     Other reaction(s): diarrhea    Current Medications:  Current Outpatient Medications:    Calcium  Carb-Cholecalciferol (CALCIUM 600+D3 PO), Take 1 tablet by mouth daily., Disp: , Rfl:    Flaxseed, Linseed, (FLAXSEED OIL) 1000 MG CAPS, Take 1,000 mg by mouth daily., Disp: , Rfl:    Multiple Vitamin (MULTIVITAMIN WITH MINERALS) TABS tablet, Take 1 tablet by mouth daily. One-A-Day for Women, Disp: , Rfl:    olmesartan (BENICAR) 20 MG tablet, Take 20 mg by mouth daily., Disp: , Rfl:    olmesartan (BENICAR) 40 MG tablet, Take 40 mg by mouth daily., Disp: , Rfl:    Omega-3 Fatty Acids (FISH OIL) 1000 MG CAPS, Take 1,000 mg by mouth daily., Disp: , Rfl:    tamoxifen (NOLVADEX) 20 MG tablet, TAKE 1 TABLET EVERY DAY, Disp: 90 tablet, Rfl: 3   tamoxifen (NOLVADEX) 20 MG tablet, Take 1 tablet by mouth daily., Disp: , Rfl:   Past Medical Problems: Past Medical History:  Diagnosis Date   Arm mass, right    Cancer [facility])    breast   History of bilateral breast cancer released from oncologist-- dr Truddie Coco 2010, (02-01-2019 per pt no recurrence)   dx right side 1999 s/p  breast lumpectomy and IMRT;  dx left side 2000 s/p breast lumpectomy and completed IMRT 2000   OA (osteoarthritis)    Shoulder mass    right   Wears hearing aid in both ears    Wears partial dentures  upper    Past Surgical History: Past Surgical History:  Procedure Laterality Date   ABDOMINAL HYSTERECTOMY     BREAST LUMPECTOMY Bilateral right 1999; left 04/ 2000   CATARACT EXTRACTION W/ INTRAOCULAR LENS  IMPLANT, BILATERAL  yrs ago   COLONOSCOPY     KNEE ARTHROSCOPY W/ MENISCAL REPAIR Right 2018   LESION EXCISION WITH COMPLEX REPAIR Bilateral 03/11/2019   Procedure: Bilateral excision of eye lid xanthelasma;  Surgeon: Wallace Going, DO;  Location: [place];  Service: Plastics;  Laterality: Bilateral;  1 hour, please   LUMBAR FUSION  07-07-2001   dr Carloyn Manner '@[facility]    L5 --S1    MASS EXCISION N/A 02/04/2019   Procedure: EXCISION OF RIGHT SHOULDER SKIN MASS AND LEFT ARM SKIN MASS;  Surgeon: Kieth Brightly, Arta Bruce, MD;  Location[facility];  Service: General;  Laterality: N/A;   REMOVAL OF URINARY SLING  12-20-2003   dr Jeffie Pollock '@[facility]    SCAR REVISION N/A 07/21/2019   Procedure: Excision and release of abdominal wall contracture;  Surgeon: Wallace Going, DO;  Location: [place];  Service: Plastics;  Laterality: N/A;  2 hours   SIMPLE MASTECTOMY WITH AXILLARY SENTINEL NODE BIOPSY Left 03/16/2020   Procedure: LEFT MASTECTOMY;  Surgeon: Rolm Bookbinder, MD;  Location: [place];  Service: General;  Laterality: Left;  PEC BLOCK   TOTAL ABDOMINAL HYSTERECTOMY W/ BILATERAL SALPINGOOPHORECTOMY  1995   TRANSOBTURATOR SLING  08/2003   WISDOM TOOTH EXTRACTION      Social History: Social History   Socioeconomic History   Marital status: Married    Spouse name: Not on file   Number of children: Not on file   Years of education: Not on file   Highest education level: Not on file  Occupational History   Not on file  Tobacco Use   Smoking status: Never   Smokeless tobacco: Never  Vaping Use   Vaping Use: Never used  Substance and Sexual Activity   Alcohol use: No   Drug use: Never   Sexual activity: Not Currently    Birth control/protection: Surgical, Post-menopausal    Comment: Hysterectomy  Other Topics Concern   Not on file  Social History Narrative   Not on file   Social Determinants of Health   Financial Resource Strain: Not on file  Food Insecurity: Not on file  Transportation Needs: Not on file  Physical Activity: Not on file  Stress: Not on file  Social Connections: Not on file  Intimate Partner Violence: Not on file    Family History: Family History  Problem Relation Age of Onset   Breast cancer Mother    Breast cancer Maternal Grandmother    Cancer Sister    Cervical cancer Maternal Aunt    Colon cancer Neg Hx    Rectal cancer Neg Hx    Stomach cancer Neg Hx     Review of Systems: Review of Systems  Constitutional: Negative.    Respiratory: Negative.    Cardiovascular: Negative.   Gastrointestinal: Negative.   Musculoskeletal: Negative.   Neurological: Negative.     Physical Exam: Vital Signs BP (!) 195/81 (BP Location: Right Arm, Patient Position: Sitting, Cuff Size: Normal)   Pulse 84   LMP  (LMP Unknown)   SpO2 97%   Physical Exam Constitutional:      General: Not in acute distress.    Appearance: Normal appearance. Not ill-appearing.  HENT:     Head: Normocephalic and atraumatic.  Eyes:     Pupils: Pupils are equal, round Neck:     Musculoskeletal: Normal range of motion.  Cardiovascular:     Rate and Rhythm: Normal rate    Pulses: Normal pulses.  Pulmonary:     Effort: Pulmonary effort is normal. No respiratory distress.  Abdominal:     General: Abdomen is flat. There is no distension.  Musculoskeletal: Normal range of motion.  Skin:    General: Skin is warm and dry.     Findings: No erythema or rash.  Neurological:     General: No focal deficit present.     Mental Status: Alert and oriented to person, place, and time. Mental status is at baseline.     Motor: No weakness.  Psychiatric:        Mood and Affect: Mood normal.        Behavior: Behavior normal.    Assessment/Plan: The patient is scheduled for excision of left breast with Dr. Marla Roe.  Risks, benefits, and alternatives of procedure discussed, questions answered and consent obtained.    Smoking Status: Non-smoker; Counseling Given?  N/A Last Mammogram: June 2023 at [facility]; Results: Negative  Caprini Score: 10, highest; Risk Factors include: Age, on tamoxifen, history of cancer, BMI > 25, and length of planned surgery. Recommendation for mechanical and possible chemoprophylaxis with Lovenox. Encourage early ambulation.   Pictures obtained: Pictures were taken at previous visit  Post-op Rx sent to pharmacy:  Keflex Patient requests if we can inject long-acting numbing medication postoperatively to assist with pain  control  Recommend stopping fish oil, flaxseed oil and tamoxifen prior to surgery.  Provided patient with specific dates to start and stop.  We will send clearance to oncology for stopping the tamoxifen  Patient was provided with the General Surgical Risk consent document and Pain Medication Agreement prior to their appointment.  They had adequate time to read through the risk consent documents and Pain Medication Agreement. We also discussed them in person together during this preop appointment. All of their questions were answered to their satisfaction.  Recommended calling if they have any further questions.  Risk consent form and Pain Medication Agreement to be scanned into patient's chart.  The risks that can be encountered with and after liposuction were discussed and include the following but no limited to these:  Asymmetry, fluid accumulation, firmness of the area, fat necrosis with death of fat tissue, bleeding, infection, delayed healing, anesthesia risks, skin sensation changes, injury to structures including nerves, blood vessels, and muscles which may be temporary or permanent, allergies to tape, suture materials and glues, blood products, topical preparations or injected agents, skin and contour irregularities, skin discoloration and swelling, deep vein thrombosis, cardiac and pulmonary complications, pain, which may persist, persistent pain, recurrence of the lesion, poor healing of the incision, possible need for revisional surgery or staged procedures. Thiere can also be persistent swelling, poor wound healing, rippling or loose skin, worsening of cellulite, swelling, and thermal burn or heat injury from ultrasound with the ultrasound-assisted lipoplasty technique. Any change in weight fluctuations can alter the outcome. I   Electronically signed by: Carola Rhine Loyal Rudy, PA-C 05/21/2022 3:59 PM

## 2022-05-21 NOTE — Telephone Encounter (Signed)
Faxed  STAT surgery clearance to Dr. Nicholas Lose requesting to hold Tamoxifen starting today for 05/30/2022 surgery and restart 2 weeks from surgery.

## 2022-05-21 NOTE — H&P (View-Only) (Signed)
Patient ID: Sarah Barrett, female    DOB: [birth year], 82 y.o.   MRN: [id number]  Chief Complaint  Patient presents with   Pre-op Exam      ICD-10-CM   1. Malignant neoplasm of upper-outer quadrant of left breast in female, estrogen receptor positive ([place])  C50.412    Z17.0     2. Postoperative breast asymmetry  N64.89     3. S/P mastectomy, left  Z90.12        History of Present Illness: Sarah Barrett is a 82 y.o.  female  with a history of left mastectomy October 2021.  She presents for preoperative evaluation for upcoming procedure, excision and liposuction of left breast excess tissue for improved symmetry, scheduled for 05/30/2022 with Dr. Marla Roe.  The patient has not had problems with anesthesia. No history of DVT/PE.  No family history of DVT/PE.  No family or personal history of bleeding or clotting disorders.  Patient is not currently taking any blood thinners.  No history of CVA/MI.   Summary of Previous Visit: Patient had left mastectomy October 2021, unhappy with the contour of the left breast and would like to have it smoothed out so it is not so irritating when she is wearing her prosthetic.  She does have a history of radiation on both sides.  PMH Significant for: Bilateral breast cancer, bilateral breast radiation, left breast mastectomy  Patient is on tamoxifen, we will request for patient to be on hold this prior to surgery and 2 weeks postoperatively to decrease risk of VTE.  Patient reports she has been feeling well lately, no changes to her health.  Denies any infectious symptoms.  She reports no issues in the past with surgery.   Past Medical History: Allergies: Allergies  Allergen Reactions   Ace Inhibitors Cough    Other reaction(s): cough   Morphine And Related Other (See Comments)    Hallucinations, doesn't like how it made her feel   Prednisone     Other reaction(s): diarrhea    Current Medications:  Current Outpatient Medications:    Calcium  Carb-Cholecalciferol (CALCIUM 600+D3 PO), Take 1 tablet by mouth daily., Disp: , Rfl:    Flaxseed, Linseed, (FLAXSEED OIL) 1000 MG CAPS, Take 1,000 mg by mouth daily., Disp: , Rfl:    Multiple Vitamin (MULTIVITAMIN WITH MINERALS) TABS tablet, Take 1 tablet by mouth daily. One-A-Day for Women, Disp: , Rfl:    olmesartan (BENICAR) 20 MG tablet, Take 20 mg by mouth daily., Disp: , Rfl:    olmesartan (BENICAR) 40 MG tablet, Take 40 mg by mouth daily., Disp: , Rfl:    Omega-3 Fatty Acids (FISH OIL) 1000 MG CAPS, Take 1,000 mg by mouth daily., Disp: , Rfl:    tamoxifen (NOLVADEX) 20 MG tablet, TAKE 1 TABLET EVERY DAY, Disp: 90 tablet, Rfl: 3   tamoxifen (NOLVADEX) 20 MG tablet, Take 1 tablet by mouth daily., Disp: , Rfl:   Past Medical Problems: Past Medical History:  Diagnosis Date   Arm mass, right    Cancer [facility])    breast   History of bilateral breast cancer released from oncologist-- dr Truddie Coco 2010, (02-01-2019 per pt no recurrence)   dx right side 1999 s/p  breast lumpectomy and IMRT;  dx left side 2000 s/p breast lumpectomy and completed IMRT 2000   OA (osteoarthritis)    Shoulder mass    right   Wears hearing aid in both ears    Wears partial dentures  upper    Past Surgical History: Past Surgical History:  Procedure Laterality Date   ABDOMINAL HYSTERECTOMY     BREAST LUMPECTOMY Bilateral right 1999; left 04/ 2000   CATARACT EXTRACTION W/ INTRAOCULAR LENS  IMPLANT, BILATERAL  yrs ago   COLONOSCOPY     KNEE ARTHROSCOPY W/ MENISCAL REPAIR Right 2018   LESION EXCISION WITH COMPLEX REPAIR Bilateral 03/11/2019   Procedure: Bilateral excision of eye lid xanthelasma;  Surgeon: Wallace Going, DO;  Location: [place];  Service: Plastics;  Laterality: Bilateral;  1 hour, please   LUMBAR FUSION  07-07-2001   dr Carloyn Manner '@[facility]    L5 --S1    MASS EXCISION N/A 02/04/2019   Procedure: EXCISION OF RIGHT SHOULDER SKIN MASS AND LEFT ARM SKIN MASS;  Surgeon: Kieth Brightly, Arta Bruce, MD;  Location: [facility];  Service: General;  Laterality: N/A;   REMOVAL OF URINARY SLING  12-20-2003   dr Jeffie Pollock '@[facility]    SCAR REVISION N/A 07/21/2019   Procedure: Excision and release of abdominal wall contracture;  Surgeon: Wallace Going, DO;  Location: [place];  Service: Plastics;  Laterality: N/A;  2 hours   SIMPLE MASTECTOMY WITH AXILLARY SENTINEL NODE BIOPSY Left 03/16/2020   Procedure: LEFT MASTECTOMY;  Surgeon: Rolm Bookbinder, MD;  Location: [place];  Service: General;  Laterality: Left;  PEC BLOCK   TOTAL ABDOMINAL HYSTERECTOMY W/ BILATERAL SALPINGOOPHORECTOMY  1995   TRANSOBTURATOR SLING  08/2003   WISDOM TOOTH EXTRACTION      Social History: Social History   Socioeconomic History   Marital status: Married    Spouse name: Not on file   Number of children: Not on file   Years of education: Not on file   Highest education level: Not on file  Occupational History   Not on file  Tobacco Use   Smoking status: Never   Smokeless tobacco: Never  Vaping Use   Vaping Use: Never used  Substance and Sexual Activity   Alcohol use: No   Drug use: Never   Sexual activity: Not Currently    Birth control/protection: Surgical, Post-menopausal    Comment: Hysterectomy  Other Topics Concern   Not on file  Social History Narrative   Not on file   Social Determinants of Health   Financial Resource Strain: Not on file  Food Insecurity: Not on file  Transportation Needs: Not on file  Physical Activity: Not on file  Stress: Not on file  Social Connections: Not on file  Intimate Partner Violence: Not on file    Family History: Family History  Problem Relation Age of Onset   Breast cancer Mother    Breast cancer Maternal Grandmother    Cancer Sister    Cervical cancer Maternal Aunt    Colon cancer Neg Hx    Rectal cancer Neg Hx    Stomach cancer Neg Hx     Review of Systems: Review of Systems  Constitutional: Negative.    Respiratory: Negative.    Cardiovascular: Negative.   Gastrointestinal: Negative.   Musculoskeletal: Negative.   Neurological: Negative.     Physical Exam: Vital Signs BP (!) 195/81 (BP Location: Right Arm, Patient Position: Sitting, Cuff Size: Normal)   Pulse 84   LMP  (LMP Unknown)   SpO2 97%   Physical Exam Constitutional:      General: Not in acute distress.    Appearance: Normal appearance. Not ill-appearing.  HENT:     Head: Normocephalic and atraumatic.  Eyes:     Pupils: Pupils are equal, round Neck:     Musculoskeletal: Normal range of motion.  Cardiovascular:     Rate and Rhythm: Normal rate    Pulses: Normal pulses.  Pulmonary:     Effort: Pulmonary effort is normal. No respiratory distress.  Abdominal:     General: Abdomen is flat. There is no distension.  Musculoskeletal: Normal range of motion.  Skin:    General: Skin is warm and dry.     Findings: No erythema or rash.  Neurological:     General: No focal deficit present.     Mental Status: Alert and oriented to person, place, and time. Mental status is at baseline.     Motor: No weakness.  Psychiatric:        Mood and Affect: Mood normal.        Behavior: Behavior normal.    Assessment/Plan: The patient is scheduled for excision of left breast with Dr. Marla Roe.  Risks, benefits, and alternatives of procedure discussed, questions answered and consent obtained.    Smoking Status: Non-smoker; Counseling Given?  N/A Last Mammogram: June 2023 at [place]; Results: Negative  Caprini Score: 10, highest; Risk Factors include: Age, on tamoxifen, history of cancer, BMI > 25, and length of planned surgery. Recommendation for mechanical and possible chemoprophylaxis with Lovenox. Encourage early ambulation.   Pictures obtained: Pictures were taken at previous visit  Post-op Rx sent to pharmacy:  Keflex Patient requests if we can inject long-acting numbing medication postoperatively to assist with pain  control  Recommend stopping fish oil, flaxseed oil and tamoxifen prior to surgery.  Provided patient with specific dates to start and stop.  We will send clearance to oncology for stopping the tamoxifen  Patient was provided with the General Surgical Risk consent document and Pain Medication Agreement prior to their appointment.  They had adequate time to read through the risk consent documents and Pain Medication Agreement. We also discussed them in person together during this preop appointment. All of their questions were answered to their satisfaction.  Recommended calling if they have any further questions.  Risk consent form and Pain Medication Agreement to be scanned into patient's chart.  The risks that can be encountered with and after liposuction were discussed and include the following but no limited to these:  Asymmetry, fluid accumulation, firmness of the area, fat necrosis with death of fat tissue, bleeding, infection, delayed healing, anesthesia risks, skin sensation changes, injury to structures including nerves, blood vessels, and muscles which may be temporary or permanent, allergies to tape, suture materials and glues, blood products, topical preparations or injected agents, skin and contour irregularities, skin discoloration and swelling, deep vein thrombosis, cardiac and pulmonary complications, pain, which may persist, persistent pain, recurrence of the lesion, poor healing of the incision, possible need for revisional surgery or staged procedures. Thiere can also be persistent swelling, poor wound healing, rippling or loose skin, worsening of cellulite, swelling, and thermal burn or heat injury from ultrasound with the ultrasound-assisted lipoplasty technique. Any change in weight fluctuations can alter the outcome. I   Electronically signed by: Carola Rhine Breylon Sherrow, PA-C 05/21/2022 3:59 PM

## 2022-05-24 ENCOUNTER — Encounter (HOSPITAL_BASED_OUTPATIENT_CLINIC_OR_DEPARTMENT_OTHER): Payer: Self-pay | Admitting: Plastic Surgery

## 2022-05-24 ENCOUNTER — Encounter: Payer: Self-pay | Admitting: Surgical

## 2022-05-24 ENCOUNTER — Other Ambulatory Visit: Payer: Self-pay

## 2022-05-24 NOTE — Progress Notes (Signed)
Received a clearance form from Dr. Nicholas Lose, patient's oncologist stating that it is okay for patient to hold tamoxifen prior to surgery and for 2 weeks after surgery.

## 2022-05-24 NOTE — H&P (View-Only) (Signed)
Received a clearance form from Dr. Nicholas Lose, patient's oncologist stating that it is okay for patient to hold tamoxifen prior to surgery and for 2 weeks after surgery.

## 2022-05-27 ENCOUNTER — Encounter (HOSPITAL_BASED_OUTPATIENT_CLINIC_OR_DEPARTMENT_OTHER)
Admission: RE | Admit: 2022-05-27 | Discharge: 2022-05-27 | Disposition: A | Discharge status: Home / Self Care | Payer: Medicare HMO | Source: Ambulatory Visit | Attending: Plastic Surgery | Admitting: Plastic Surgery

## 2022-05-27 ENCOUNTER — Telehealth: Payer: Self-pay | Admitting: Plastic Surgery

## 2022-05-27 DIAGNOSIS — Z79810 Long term (current) use of selective estrogen receptor modulators (SERMs): Secondary | ICD-10-CM | POA: Diagnosis not present

## 2022-05-27 DIAGNOSIS — Z01810 Encounter for preprocedural cardiovascular examination: Secondary | ICD-10-CM | POA: Insufficient documentation

## 2022-05-27 DIAGNOSIS — C50412 Malignant neoplasm of upper-outer quadrant of left female breast: Secondary | ICD-10-CM | POA: Diagnosis not present

## 2022-05-27 DIAGNOSIS — I10 Essential (primary) hypertension: Secondary | ICD-10-CM | POA: Diagnosis not present

## 2022-05-27 DIAGNOSIS — Z9012 Acquired absence of left breast and nipple: Secondary | ICD-10-CM | POA: Diagnosis not present

## 2022-05-27 DIAGNOSIS — N6489 Other specified disorders of breast: Secondary | ICD-10-CM | POA: Diagnosis not present

## 2022-05-27 DIAGNOSIS — Z170 Estrogen receptor positive status [ER+]: Secondary | ICD-10-CM | POA: Diagnosis not present

## 2022-05-27 MED ORDER — CEPHALEXIN 500 MG PO CAPS: 500.0000 mg | ORAL_CAPSULE | Freq: Four times a day (QID) | ORAL | 0 refills | Status: AC

## 2022-05-27 NOTE — Telephone Encounter (Signed)
Post-op rx

## 2022-05-27 NOTE — Telephone Encounter (Signed)
Pt came in to ask about her medication that she needs for her sx on 1.11.24. Pt stated we are to order her medication and wanted to make sure it goes to her pharm at [facility] on [facility].

## 2022-05-28 ENCOUNTER — Telehealth: Payer: Self-pay | Admitting: *Deleted

## 2022-05-28 NOTE — Telephone Encounter (Signed)
Call placed to patient to inform her that her post-op prescription for Keflex had been called in to the [facility] [place] and that she would not start this medication until after surgery. Pt stated she had stopped taking her fish oil, flax seed oil, and tamoxifen as directed. States she wasn't sure about her BP medication so she also had not been taking the Olmesartan. Advised her to continue taking her BP med as directed unless she received different instructions from the surgery center. Pt verbalized understanding of the above.

## 2022-05-29 NOTE — Anesthesia Preprocedure Evaluation (Signed)
Anesthesia Evaluation  Patient identified by MRN, date of birth, ID band Patient awake    Reviewed: Allergy & Precautions, H&P , NPO status , Patient's Chart, lab work & pertinent test results  Airway Mallampati: III  TM Distance: >3 FB Neck ROM: Full    Dental  (+) Partial Upper, Dental Advisory Given   Pulmonary neg pulmonary ROS   Pulmonary exam normal breath sounds clear to auscultation       Cardiovascular hypertension (137/60 in preop), Pt. on medications Normal cardiovascular exam Rhythm:Regular Rate:Normal     Neuro/Psych negative neurological ROS  negative psych ROS   GI/Hepatic negative GI ROS, Neg liver ROS,,,  Endo/Other  negative endocrine ROS    Renal/GU negative Renal ROS  negative genitourinary   Musculoskeletal negative musculoskeletal ROS (+) Arthritis , Osteoarthritis,  Bilateral breast cancer here for left mastectomy   Abdominal  (+)  Abdomen: soft. Bowel sounds: normal.  Peds negative pediatric ROS (+)  Hematology negative hematology ROS (+)   Anesthesia Other Findings   Reproductive/Obstetrics negative OB ROS                             Anesthesia Physical Anesthesia Plan  ASA: 2  Anesthesia Plan: General   Post-op Pain Management:  Regional for Post-op pain and Tylenol PO (pre-op)* and Toradol IV (intra-op)*   Induction: Intravenous  PONV Risk Score and Plan: 3 and Ondansetron, Dexamethasone, Midazolam and Treatment may vary due to age or medical condition  Airway Management Planned: LMA  Additional Equipment: None  Intra-op Plan:   Post-operative Plan: Extubation in OR  Informed Consent: I have reviewed the patients History and Physical, chart, labs and discussed the procedure including the risks, benefits and alternatives for the proposed anesthesia with the patient or authorized representative who has indicated his/her understanding and acceptance.      Dental advisory given  Plan Discussed with: Anesthesiologist and CRNA  Anesthesia Plan Comments: (Lab Results      Component                Value               Date                      WBC                      4.9                 03/16/2020                HGB                      14.5                03/16/2020                HCT                      44.8                03/16/2020                MCV                      93.3  03/16/2020                PLT                      188                 03/16/2020           Lab Results      Component                Value               Date                      NA                       139                 02/04/2019                K                        4.0                 02/04/2019                CO2                      26                  10/12/2008                GLUCOSE                  90                  02/04/2019                BUN                      15                  02/04/2019                CREATININE               0.60                02/04/2019                CALCIUM                  8.9                 10/12/2008          )        Anesthesia Quick Evaluation

## 2022-05-30 ENCOUNTER — Encounter (HOSPITAL_BASED_OUTPATIENT_CLINIC_OR_DEPARTMENT_OTHER): Payer: Self-pay | Admitting: Plastic Surgery

## 2022-05-30 ENCOUNTER — Telehealth: Payer: Self-pay | Admitting: Plastic Surgery

## 2022-05-30 ENCOUNTER — Other Ambulatory Visit: Payer: Self-pay

## 2022-05-30 ENCOUNTER — Ambulatory Visit (HOSPITAL_BASED_OUTPATIENT_CLINIC_OR_DEPARTMENT_OTHER)
Admission: RE | Admit: 2022-05-30 | Discharge: 2022-05-30 | Disposition: A | Discharge status: Home / Self Care | Payer: Medicare HMO | Attending: Plastic Surgery | Admitting: Plastic Surgery

## 2022-05-30 ENCOUNTER — Ambulatory Visit (HOSPITAL_BASED_OUTPATIENT_CLINIC_OR_DEPARTMENT_OTHER): Payer: Medicare HMO | Admitting: Anesthesiology

## 2022-05-30 ENCOUNTER — Encounter (HOSPITAL_BASED_OUTPATIENT_CLINIC_OR_DEPARTMENT_OTHER)
Admission: RE | Disposition: A | Discharge status: Home / Self Care | Payer: Self-pay | Source: Home / Self Care | Attending: Plastic Surgery

## 2022-05-30 DIAGNOSIS — C50412 Malignant neoplasm of upper-outer quadrant of left female breast: Secondary | ICD-10-CM | POA: Diagnosis not present

## 2022-05-30 DIAGNOSIS — Z170 Estrogen receptor positive status [ER+]: Secondary | ICD-10-CM | POA: Insufficient documentation

## 2022-05-30 DIAGNOSIS — N6489 Other specified disorders of breast: Secondary | ICD-10-CM | POA: Diagnosis not present

## 2022-05-30 DIAGNOSIS — Z01818 Encounter for other preprocedural examination: Secondary | ICD-10-CM

## 2022-05-30 DIAGNOSIS — Z923 Personal history of irradiation: Secondary | ICD-10-CM | POA: Diagnosis not present

## 2022-05-30 DIAGNOSIS — I10 Essential (primary) hypertension: Secondary | ICD-10-CM | POA: Insufficient documentation

## 2022-05-30 DIAGNOSIS — Z853 Personal history of malignant neoplasm of breast: Secondary | ICD-10-CM | POA: Diagnosis not present

## 2022-05-30 DIAGNOSIS — Z9012 Acquired absence of left breast and nipple: Secondary | ICD-10-CM | POA: Insufficient documentation

## 2022-05-30 DIAGNOSIS — N651 Disproportion of reconstructed breast: Secondary | ICD-10-CM

## 2022-05-30 DIAGNOSIS — Z79810 Long term (current) use of selective estrogen receptor modulators (SERMs): Secondary | ICD-10-CM | POA: Insufficient documentation

## 2022-05-30 DIAGNOSIS — L905 Scar conditions and fibrosis of skin: Secondary | ICD-10-CM | POA: Diagnosis not present

## 2022-05-30 HISTORY — PX: BREAST CYST EXCISION: SHX579

## 2022-05-30 HISTORY — PX: LIPOSUCTION: SHX10

## 2022-05-30 SURGERY — LIPOSUCTION
Anesthesia: General | Site: Breast | Laterality: Left

## 2022-05-30 MED ORDER — FENTANYL CITRATE (PF) 100 MCG/2ML IJ SOLN
INTRAMUSCULAR | Status: DC | PRN
  Administered 2022-05-30: 50 ug via INTRAVENOUS

## 2022-05-30 MED ORDER — DEXAMETHASONE SODIUM PHOSPHATE 4 MG/ML IJ SOLN
INTRAMUSCULAR | Status: DC | PRN
  Administered 2022-05-30: 4 mg via INTRAVENOUS

## 2022-05-30 MED ORDER — PHENYLEPHRINE 80 MCG/ML (10ML) SYRINGE FOR IV PUSH (FOR BLOOD PRESSURE SUPPORT)
PREFILLED_SYRINGE | INTRAVENOUS | Status: AC
  Filled 2022-05-30: qty 10

## 2022-05-30 MED ORDER — LIDOCAINE 2% (20 MG/ML) 5 ML SYRINGE
INTRAMUSCULAR | Status: AC
  Filled 2022-05-30: qty 5

## 2022-05-30 MED ORDER — ONDANSETRON HCL 4 MG/2ML IJ SOLN
INTRAMUSCULAR | Status: DC | PRN
  Administered 2022-05-30: 4 mg via INTRAVENOUS

## 2022-05-30 MED ORDER — CEFAZOLIN SODIUM-DEXTROSE 2-4 GM/100ML-% IV SOLN
2.0000 g | INTRAVENOUS | Status: AC
  Administered 2022-05-30: 2 g via INTRAVENOUS

## 2022-05-30 MED ORDER — EPHEDRINE 5 MG/ML INJ
INTRAVENOUS | Status: AC
  Filled 2022-05-30: qty 5

## 2022-05-30 MED ORDER — SODIUM CHLORIDE 0.9 % IV SOLN: 250.0000 mL | INTRAVENOUS | Status: DC | PRN

## 2022-05-30 MED ORDER — PROMETHAZINE HCL 25 MG/ML IJ SOLN: 6.2500 mg | INTRAMUSCULAR | Status: DC | PRN

## 2022-05-30 MED ORDER — DEXAMETHASONE SODIUM PHOSPHATE 10 MG/ML IJ SOLN
INTRAMUSCULAR | Status: AC
  Filled 2022-05-30: qty 1

## 2022-05-30 MED ORDER — EPHEDRINE SULFATE (PRESSORS) 50 MG/ML IJ SOLN
INTRAMUSCULAR | Status: DC | PRN
  Administered 2022-05-30 (×2): 10 mg via INTRAVENOUS

## 2022-05-30 MED ORDER — AMISULPRIDE (ANTIEMETIC) 5 MG/2ML IV SOLN: 10.0000 mg | Freq: Once | INTRAVENOUS | Status: DC | PRN

## 2022-05-30 MED ORDER — BUPIVACAINE LIPOSOME 1.3 % IJ SUSP
INTRAMUSCULAR | Status: DC | PRN
  Administered 2022-05-30: 20 mL

## 2022-05-30 MED ORDER — MIDAZOLAM HCL 5 MG/5ML IJ SOLN
INTRAMUSCULAR | Status: DC | PRN
  Administered 2022-05-30: 2 mg via INTRAVENOUS

## 2022-05-30 MED ORDER — PHENYLEPHRINE HCL (PRESSORS) 10 MG/ML IV SOLN
INTRAVENOUS | Status: AC
  Filled 2022-05-30: qty 1

## 2022-05-30 MED ORDER — ONDANSETRON HCL 4 MG/2ML IJ SOLN
INTRAMUSCULAR | Status: AC
  Filled 2022-05-30: qty 2

## 2022-05-30 MED ORDER — LACTATED RINGERS IV SOLN: INTRAVENOUS | Status: DC

## 2022-05-30 MED ORDER — ACETAMINOPHEN 325 MG PO TABS: 650.0000 mg | ORAL_TABLET | ORAL | Status: DC | PRN

## 2022-05-30 MED ORDER — CHLORHEXIDINE GLUCONATE CLOTH 2 % EX PADS: 6.0000 | MEDICATED_PAD | Freq: Once | CUTANEOUS | Status: DC

## 2022-05-30 MED ORDER — ATROPINE SULFATE 0.4 MG/ML IV SOLN
INTRAVENOUS | Status: AC
  Filled 2022-05-30: qty 1

## 2022-05-30 MED ORDER — LIDOCAINE HCL (CARDIAC) PF 100 MG/5ML IV SOSY
PREFILLED_SYRINGE | INTRAVENOUS | Status: DC | PRN
  Administered 2022-05-30: 100 mg via INTRAVENOUS

## 2022-05-30 MED ORDER — SODIUM CHLORIDE 0.9% FLUSH: 3.0000 mL | INTRAVENOUS | Status: DC | PRN

## 2022-05-30 MED ORDER — CEFAZOLIN SODIUM-DEXTROSE 2-4 GM/100ML-% IV SOLN
INTRAVENOUS | Status: AC
  Filled 2022-05-30: qty 100

## 2022-05-30 MED ORDER — LIDOCAINE-EPINEPHRINE 1 %-1:100000 IJ SOLN
INTRAMUSCULAR | Status: DC | PRN
  Administered 2022-05-30: 44 mL via INTRAMUSCULAR

## 2022-05-30 MED ORDER — ACETAMINOPHEN 500 MG PO TABS
ORAL_TABLET | ORAL | Status: AC
  Filled 2022-05-30: qty 2

## 2022-05-30 MED ORDER — ACETAMINOPHEN 500 MG PO TABS
1000.0000 mg | ORAL_TABLET | Freq: Once | ORAL | Status: AC
  Administered 2022-05-30: 1000 mg via ORAL

## 2022-05-30 MED ORDER — PROPOFOL 10 MG/ML IV BOLUS
INTRAVENOUS | Status: DC | PRN
  Administered 2022-05-30: 150 mg via INTRAVENOUS

## 2022-05-30 MED ORDER — FENTANYL CITRATE (PF) 100 MCG/2ML IJ SOLN
INTRAMUSCULAR | Status: AC
  Filled 2022-05-30: qty 2

## 2022-05-30 MED ORDER — SUCCINYLCHOLINE CHLORIDE 200 MG/10ML IV SOSY
PREFILLED_SYRINGE | INTRAVENOUS | Status: AC
  Filled 2022-05-30: qty 10

## 2022-05-30 MED ORDER — FENTANYL CITRATE (PF) 100 MCG/2ML IJ SOLN: 25.0000 ug | INTRAMUSCULAR | Status: DC | PRN

## 2022-05-30 MED ORDER — SODIUM CHLORIDE 0.9% FLUSH: 3.0000 mL | Freq: Two times a day (BID) | INTRAVENOUS | Status: DC

## 2022-05-30 MED ORDER — ACETAMINOPHEN 325 MG RE SUPP: 650.0000 mg | RECTAL | Status: DC | PRN

## 2022-05-30 MED ORDER — BUPIVACAINE LIPOSOME 1.3 % IJ SUSP
INTRAMUSCULAR | Status: AC
  Filled 2022-05-30: qty 20

## 2022-05-30 MED ORDER — OXYCODONE HCL 5 MG PO TABS: 5.0000 mg | ORAL_TABLET | ORAL | Status: DC | PRN

## 2022-05-30 MED ORDER — LIDOCAINE HCL 1 % IJ SOLN
INTRAVENOUS | Status: DC | PRN
  Administered 2022-05-30: 100 mL

## 2022-05-30 MED ORDER — MIDAZOLAM HCL 2 MG/2ML IJ SOLN
INTRAMUSCULAR | Status: AC
  Filled 2022-05-30: qty 2

## 2022-05-30 MED ORDER — KETOROLAC TROMETHAMINE 30 MG/ML IJ SOLN
INTRAMUSCULAR | Status: DC | PRN
  Administered 2022-05-30: 15 mg via INTRAVENOUS

## 2022-05-30 SURGICAL SUPPLY — 53 items
ADH SKN CLS APL DERMABOND .7 (GAUZE/BANDAGES/DRESSINGS)
BAG DECANTER FOR FLEXI CONT (MISCELLANEOUS) ×1 IMPLANT
BINDER ABDOMINAL  9 SM 30-45 (SOFTGOODS)
BINDER ABDOMINAL 10 UNV 27-48 (MISCELLANEOUS) IMPLANT
BINDER ABDOMINAL 12 SM 30-45 (SOFTGOODS) IMPLANT
BINDER ABDOMINAL 9 SM 30-45 (SOFTGOODS) IMPLANT
BINDER BREAST LRG (GAUZE/BANDAGES/DRESSINGS) IMPLANT
BINDER BREAST MEDIUM (GAUZE/BANDAGES/DRESSINGS) IMPLANT
BINDER BREAST XLRG (GAUZE/BANDAGES/DRESSINGS) IMPLANT
BINDER BREAST XXLRG (GAUZE/BANDAGES/DRESSINGS) IMPLANT
BLADE HEX COATED 2.75 (ELECTRODE) ×1 IMPLANT
BLADE SURG 15 STRL LF DISP TIS (BLADE) ×1 IMPLANT
BLADE SURG 15 STRL SS (BLADE) ×1
BNDG GAUZE DERMACEA FLUFF 4 (GAUZE/BANDAGES/DRESSINGS) ×2 IMPLANT
BNDG GZE DERMACEA 4 6PLY (GAUZE/BANDAGES/DRESSINGS) ×2
CANISTER SUCT 1200ML W/VALVE (MISCELLANEOUS) ×1 IMPLANT
COVER BACK TABLE 60X90IN (DRAPES) ×1 IMPLANT
COVER MAYO STAND STRL (DRAPES) ×1 IMPLANT
DERMABOND ADVANCED .7 DNX12 (GAUZE/BANDAGES/DRESSINGS) IMPLANT
DRAPE LAPAROSCOPIC ABDOMINAL (DRAPES) ×1 IMPLANT
ELECT BLADE 4.0 EZ CLEAN MEGAD (MISCELLANEOUS) ×1
ELECT REM PT RETURN 9FT ADLT (ELECTROSURGICAL) ×1
ELECTRODE BLDE 4.0 EZ CLN MEGD (MISCELLANEOUS) IMPLANT
ELECTRODE REM PT RTRN 9FT ADLT (ELECTROSURGICAL) ×1 IMPLANT
GAUZE PAD ABD 8X10 STRL (GAUZE/BANDAGES/DRESSINGS) ×2 IMPLANT
GLOVE BIO SURGEON STRL SZ 6.5 (GLOVE) ×2 IMPLANT
GOWN STRL REUS W/ TWL LRG LVL3 (GOWN DISPOSABLE) ×2 IMPLANT
GOWN STRL REUS W/TWL LRG LVL3 (GOWN DISPOSABLE) ×3
LINER CANISTER 1000CC FLEX (MISCELLANEOUS) ×1 IMPLANT
NDL SAFETY ECLIP 18X1.5 (MISCELLANEOUS) ×1 IMPLANT
NS IRRIG 1000ML POUR BTL (IV SOLUTION) IMPLANT
PACK BASIN DAY SURGERY FS (CUSTOM PROCEDURE TRAY) ×1 IMPLANT
PAD ALCOHOL SWAB (MISCELLANEOUS) ×1 IMPLANT
PENCIL SMOKE EVACUATOR (MISCELLANEOUS) ×1 IMPLANT
SLEEVE SCD COMPRESS KNEE MED (STOCKING) ×1 IMPLANT
SPIKE FLUID TRANSFER (MISCELLANEOUS) IMPLANT
SPONGE T-LAP 18X18 ~~LOC~~+RFID (SPONGE) ×1 IMPLANT
SUT MNCRL AB 4-0 PS2 18 (SUTURE) IMPLANT
SUT MON AB 5-0 PS2 18 (SUTURE) ×2 IMPLANT
SUT VIC AB 3-0 SH 27 (SUTURE)
SUT VIC AB 3-0 SH 27X BRD (SUTURE) IMPLANT
SUT VICRYL 4-0 PS2 18IN ABS (SUTURE) IMPLANT
SYR 50ML LL SCALE MARK (SYRINGE) ×1 IMPLANT
SYR BULB IRRIG 60ML STRL (SYRINGE) ×1 IMPLANT
SYR CONTROL 10ML LL (SYRINGE) IMPLANT
SYR TB 1ML LL NO SAFETY (SYRINGE) ×1 IMPLANT
TOWEL GREEN STERILE FF (TOWEL DISPOSABLE) ×2 IMPLANT
TRAY DSU PREP LF (CUSTOM PROCEDURE TRAY) ×1 IMPLANT
TUBE CONNECTING 20X1/4 (TUBING) ×1 IMPLANT
TUBING INFILTRATION IT-10001 (TUBING) IMPLANT
TUBING SET GRADUATE ASPIR 12FT (MISCELLANEOUS) ×1 IMPLANT
UNDERPAD 30X36 HEAVY ABSORB (UNDERPADS AND DIAPERS) ×2 IMPLANT
YANKAUER SUCT BULB TIP NO VENT (SUCTIONS) ×1 IMPLANT

## 2022-05-30 NOTE — Interval H&P Note (Signed)
History and Physical Interval Note:  05/30/2022 6:48 AM  Sarah Barrett  has presented today for surgery, with the diagnosis of Postoperative breast asymmetry.  The various methods of treatment have been discussed with the patient and family. After consideration of risks, benefits and other options for treatment, the patient has consented to  Procedure(s): LIPOSUCTION (Left) Left breast tissue excision and lipo (Left) as a surgical intervention.  The patient's history has been reviewed, patient examined, no change in status, stable for surgery.  I have reviewed the patient's chart and labs.  Questions were answered to the patient's satisfaction.     Loel Lofty Trissa Molina

## 2022-05-30 NOTE — Discharge Instructions (Addendum)
INSTRUCTIONS FOR AFTER BREAST SURGERY   You will likely have some questions about what to expect following your operation.  The following information will help you and your family understand what to expect when you are discharged from the hospital.  It is important to follow these guidelines to help ensure a smooth recovery and reduce complication.  Postoperative instructions include information on: diet, wound care, medications and physical activity.  AFTER SURGERY Expect to go home after the procedure.  In some cases, you may need to spend one night in the hospital for observation.  DIET Breast surgery does not require a specific diet.  However, the healthier you eat the better your body will heal. It is important to increasing your protein intake.  This means limiting the foods with sugar and carbohydrates.  Focus on vegetables and some meat.  If you have liposuction during your procedure be sure to drink water.  If your urine is bright yellow, then it is concentrated, and you need to drink more water.  As a general rule after surgery, you should have 8 ounces of water every hour while awake.  If you find you are persistently nauseated or unable to take in liquids let us know.  NO TOBACCO USE or EXPOSURE.  This will slow your healing process and lead to a wound.  WOUND CARE Leave the binder on for 3 days . Use fragrance free soap like Dial, Dove or [place].   After 3 days you can remove the binder to shower. Once dry apply binder or sports bra. If you have liposuction you will have a soft and spongy dressing (Lipofoam) that helps prevent creases in your skin.  Remove before you shower and then replace it.  It is also available on [employer]. If you have steri-strips / tape directly attached to your skin leave them in place. It is OK to get these wet.   No baths, pools or hot tubs for four weeks. We close your incision to leave the smallest and best-looking scar. No ointment or creams on your incisions  for four weeks.  No Neosporin (Too many skin reactions).  A few weeks after surgery you can use Mederma and start massaging the scar. We ask you to wear your binder or sports bra for the first 6 weeks around the clock, including while sleeping. This provides added comfort and helps reduce the fluid accumulation at the surgery site. NO Ice or heating pads to the operative site.  You have a very high risk of a BURN before you feel the temperature change.  ACTIVITY No heavy lifting until cleared by the doctor.  This usually means no more than a half-gallon of milk.  It is OK to walk and climb stairs. Moving your legs is very important to decrease your risk of a blood clot.  It will also help keep you from getting deconditioned.  Every 1 to 2 hours get up and walk for 5 minutes. This will help with a quicker recovery back to normal.  Let pain be your guide so you don't do too much.  This time is for you to recover.  You will be more comfortable if you sleep and rest with your head elevated either with a few pillows under you or in a recliner.  No stomach sleeping for a three months.  WORK Everyone returns to work at different times. As a rough guide, most people take at least 1 - 2 weeks off prior to returning to work. If  you need documentation for your job, give the forms to the front staff at the clinic.  DRIVING Arrange for someone to bring you home from the hospital after your surgery.  You may be able to drive a few days after surgery but not while taking any narcotics or valium.  BOWEL MOVEMENTS Constipation can occur after anesthesia and while taking pain medication.  It is important to stay ahead for your comfort.  We recommend taking Milk of Magnesia (2 tablespoons; twice a day) while taking the pain pills.  MEDICATIONS You may be prescribed should start after surgery At your preoperative visit for you history and physical you may have been given the following medications: An antibiotic: Start  this medication when you get home and take according to the instructions on the bottle. Zofran 4 mg:  This is to treat nausea and vomiting.  You can take this every 6 hours as needed and only if needed. Valium 2 mg for breast cancer patients: This is for muscle tightness if you have an implant or expander. This will help relax your muscle which also helps with pain control.  This can be taken every 12 hours as needed. Don't drive after taking this medication. Norco (hydrocodone/acetaminophen) 5/325 mg:  This is only to be used after you have taken the Motrin or the Tylenol. Every 8 hours as needed.   Over the counter Medication to take: Ibuprofen (Motrin) 600 mg:  Take this every 6 hours.  If you have additional pain then take 500 mg of the Tylenol every 8 hours.  Only take the Norco after you have tried these two. MiraLAX or Milk of Magnesia: Take this according to the bottle if you take the [place] Call your surgeon's office if any of the following occur: Fever 101 degrees F or greater Excessive bleeding or fluid from the incision site. Pain that increases over time without aid from the medications Redness, warmth, or pus draining from incision sites Persistent nausea or inability to take in liquids Severe misshapen area that underwent the operation.   No Tylenol until after 12:30pm today if needed.  No Ibuprofen/NSAIDs until after 4pm today, if needed.  Post Anesthesia Home Care Instructions  Activity: Get plenty of rest for the remainder of the day. A responsible individual must stay with you for 24 hours following the procedure.  For the next 24 hours, DO NOT: -Drive a car -Paediatric nurse -Drink alcoholic beverages -Take any medication unless instructed by your physician -Make any legal decisions or sign important papers.  Meals: Start with liquid foods such as gelatin or soup. Progress to regular foods as tolerated. Avoid greasy, spicy, heavy foods. If nausea  and/or vomiting occur, drink only clear liquids until the nausea and/or vomiting subsides. Call your physician if vomiting continues.  Special Instructions/Symptoms: Your throat may feel dry or sore from the anesthesia or the breathing tube placed in your throat during surgery. If this causes discomfort, gargle with warm salt water. The discomfort should disappear within 24 hours.  If you had a scopolamine patch placed behind your ear for the management of post- operative nausea and/or vomiting:  1. The medication in the patch is effective for 72 hours, after which it should be removed.  Wrap patch in a tissue and discard in the trash. Wash hands thoroughly with soap and water. 2. You may remove the patch earlier than 72 hours if you experience unpleasant side effects which may include dry mouth, dizziness or  visual disturbances. 3. Avoid touching the patch. Wash your hands with soap and water after contact with the patch.  Information for Discharge Teaching: EXPAREL (bupivacaine liposome injectable suspension)   Your surgeon or anesthesiologist gave you EXPAREL(bupivacaine) to help control your pain after surgery.  EXPAREL is a local anesthetic that provides pain relief by numbing the tissue around the surgical site. EXPAREL is designed to release pain medication over time and can control pain for up to 72 hours. Depending on how you respond to EXPAREL, you may require less pain medication during your recovery.  Possible side effects: Temporary loss of sensation or ability to move in the area where bupivacaine was injected. Nausea, vomiting, constipation Rarely, numbness and tingling in your mouth or lips, lightheadedness, or anxiety may occur. Call your doctor right away if you think you may be experiencing any of these sensations, or if you have other questions regarding possible side effects.  Follow all other discharge instructions given to you by your surgeon or nurse. Eat a healthy  diet and drink plenty of water or other fluids.  If you return to the hospital for any reason within 96 hours following the administration of EXPAREL, it is important for health care providers to know that you have received this anesthetic. A teal colored band has been placed on your arm with the date, time and amount of EXPAREL you have received in order to alert and inform your health care providers. Please leave this armband in place for the full 96 hours following administration, and then you may remove the band.

## 2022-05-30 NOTE — Anesthesia Postprocedure Evaluation (Signed)
Anesthesia Post Note  Patient: Sarah Barrett  Procedure(s) Performed: LIPOSUCTION (Left: Breast) Left breast tissue excision and lipo (Left: Breast)     Patient location during evaluation: PACU Anesthesia Type: General Level of consciousness: sedated Pain management: pain level controlled Vital Signs Assessment: post-procedure vital signs reviewed and stable Respiratory status: spontaneous breathing and respiratory function stable Cardiovascular status: stable Postop Assessment: no apparent nausea or vomiting Anesthetic complications: no   No notable events documented.  Last Vitals:  Vitals:   05/30/22 0930 05/30/22 0947  BP: (!) 136/56 138/65  Pulse: 85 88  Resp: 17 18  Temp:  (!) 36.4 C  SpO2: 95% 93%    Last Pain:  Vitals:   05/30/22 0930  TempSrc:   PainSc: 0-No pain                 Jaquaveon Bilal DANIEL

## 2022-05-30 NOTE — Telephone Encounter (Signed)
Pt called stating [facility] says they do not have her medication.  She would like you to call [facility] and speak with them and then call her back.  She had surgery today.

## 2022-05-30 NOTE — Op Note (Signed)
DATE OF OPERATION: 05/30/2022  LOCATION: [facility]  PREOPERATIVE DIAGNOSIS: left breast asymmetry with excess  POSTOPERATIVE DIAGNOSIS: Same  PROCEDURE: Excision of left breast excess tissue with contracture release 10 x 10 cm area  SURGEON: Quenesha Douglass Sanger Enrrique Mierzwa, DO  ASSISTANT: Roetta Sessions, PA  EBL: 5 cc  CONDITION: Stable  COMPLICATIONS: None  INDICATION: The patient, Sarah Barrett, is a 82 y.o. female born on 27-Mar-1941, is here for treatment of excess left breast tissue after a mastectomy.  The patient also had radiation so she was aware we need to be careful.   PROCEDURE DETAILS:  The patient was seen prior to surgery and marked.  The IV antibiotics were given. The patient was taken to the operating room and given a general anesthetic. A standard time out was performed and all information was confirmed by those in the room. SCDs were placed.   The chest was prepped and draped.  The #15 blade was used to make an incision at the inframammary fold 1 cm in size.  Tumescent was infused in the medial and lateral left breast.  Liposuction was done for better contour.  The forked blade was used to excise and release the scar contracture of the 10 x 10 cm area. The incision was closed with the 5-0 Monocryl.  Experel was placed in the lateral breast area. The patient was allowed to wake up and taken to recovery room in stable condition at the end of the case. The family was notified at the end of the case.   The advanced practice practitioner (APP) assisted throughout the case.  The APP was essential in retraction and counter traction when needed to make the case progress smoothly.  This retraction and assistance made it possible to see the tissue plans for the procedure.  The assistance was needed for blood control, tissue re-approximation and assisted with closure of the incision site.

## 2022-05-30 NOTE — Interval H&P Note (Signed)
History and Physical Interval Note:  05/30/2022 6:48 AM  Sarah Barrett  has presented today for surgery, with the diagnosis of Postoperative breast asymmetry.  The various methods of treatment have been discussed with the patient and family. After consideration of risks, benefits and other options for treatment, the patient has consented to  Procedure(s): LIPOSUCTION (Left) Left breast tissue excision and lipo (Left) as a surgical intervention.  The patient's history has been reviewed, patient examined, no change in status, stable for surgery.  I have reviewed the patient's chart and labs.  Questions were answered to the patient's satisfaction.     Loel Lofty Tatyana Biber

## 2022-05-30 NOTE — Transfer of Care (Signed)
Immediate Anesthesia Transfer of Care Note  Patient: Sarah Barrett  Procedure(s) Performed: LIPOSUCTION (Left: Breast) Left breast tissue excision and lipo (Left: Breast)  Patient Location: PACU  Anesthesia Type:General  Level of Consciousness: sedated  Airway & Oxygen Therapy: Patient Spontanous Breathing and Patient connected to face mask oxygen  Post-op Assessment: Report given to RN and Post -op Vital signs reviewed and stable  Post vital signs: Reviewed and stable  Last Vitals:  Vitals Value Taken Time  BP    Temp    Pulse    Resp    SpO2      Last Pain:  Vitals:   05/30/22 0628  TempSrc: Oral  PainSc: 0-No pain      Patients Stated Pain Goal: 3 (01/56/15 3794)  Complications: No notable events documented.

## 2022-05-30 NOTE — Anesthesia Procedure Notes (Signed)
Procedure Name: LMA Insertion Date/Time: 05/30/2022 7:43 AM  Performed by: Willa Frater, CRNAPre-anesthesia Checklist: Patient identified, Emergency Drugs available, Suction available and Patient being monitored Patient Re-evaluated:Patient Re-evaluated prior to induction Oxygen Delivery Method: Circle system utilized Preoxygenation: Pre-oxygenation with 100% oxygen Induction Type: IV induction Ventilation: Mask ventilation without difficulty LMA: LMA inserted LMA Size: 4.0 Number of attempts: 1 Airway Equipment and Method: Bite block Placement Confirmation: positive ETCO2 Tube secured with: Tape Dental Injury: Teeth and Oropharynx as per pre-operative assessment

## 2022-05-31 ENCOUNTER — Encounter (HOSPITAL_BASED_OUTPATIENT_CLINIC_OR_DEPARTMENT_OTHER): Payer: Self-pay | Admitting: Plastic Surgery

## 2022-05-31 NOTE — Telephone Encounter (Signed)
Spoke with patient, she reports she called the [facility] to check if they had her prescription.  She was unable to get a hold of the pharmacist.  I discussed with the patient that the medication was called in on the eighth and it should be ready, however we can avoid using an antibiotic after surgery and recommend monitoring the area for any issues.  She did receive antibiotics intraoperatively.  She reports she is doing well, not having any issues after surgery except some mild pain.

## 2022-06-03 ENCOUNTER — Ambulatory Visit (INDEPENDENT_AMBULATORY_CARE_PROVIDER_SITE_OTHER): Payer: Medicare HMO | Admitting: Physician Assistant

## 2022-06-03 DIAGNOSIS — N6489 Other specified disorders of breast: Secondary | ICD-10-CM

## 2022-06-03 NOTE — Progress Notes (Signed)
This is an 82 year old female who is status post excision of left breast excess tissue with contracture release on 05/30/2022 by Dr. Marla Roe.  She was seen via telehealth for a postop visit; she is located in [place].  Postoperatively she notes she is doing very well with no significant issues or complaints.  She denies any infectious signs or symptoms, she notes the pain is well-controlled.  She has no other issues or concerns during today's visit.  Telehealth visit.  Physical exam-speaking in full sentences no acute distress.  Overall the patient is doing very well would like to see her back in our office on the 19th for her routine follow-up visit, she was given strict return precautions.  She verbalized understanding and agreement to today's plan had no further questions or concerns.

## 2022-06-06 ENCOUNTER — Other Ambulatory Visit: Payer: Self-pay

## 2022-06-06 ENCOUNTER — Inpatient Hospital Stay: Payer: Medicare HMO | Attending: Hematology and Oncology | Admitting: Hematology and Oncology

## 2022-06-06 VITALS — BP 177/80 | HR 77 | Resp 18 | Wt 152.1 lb

## 2022-06-06 DIAGNOSIS — C50412 Malignant neoplasm of upper-outer quadrant of left female breast: Secondary | ICD-10-CM | POA: Insufficient documentation

## 2022-06-06 DIAGNOSIS — Z79810 Long term (current) use of selective estrogen receptor modulators (SERMs): Secondary | ICD-10-CM | POA: Diagnosis not present

## 2022-06-06 DIAGNOSIS — Z9012 Acquired absence of left breast and nipple: Secondary | ICD-10-CM | POA: Diagnosis not present

## 2022-06-06 DIAGNOSIS — Z170 Estrogen receptor positive status [ER+]: Secondary | ICD-10-CM

## 2022-06-06 DIAGNOSIS — Z853 Personal history of malignant neoplasm of breast: Secondary | ICD-10-CM | POA: Insufficient documentation

## 2022-06-06 MED ORDER — TAMOXIFEN CITRATE 20 MG PO TABS: 20.0000 mg | ORAL_TABLET | Freq: Every day | ORAL | 3 refills | Status: DC

## 2022-06-06 NOTE — Progress Notes (Signed)
Patient Care Team: Collene Leyden, MD as PCP - General (Family Medicine) Nicholas Lose, MD as Consulting Physician (Hematology and Oncology) Rolm Bookbinder, MD as Consulting Physician (General Surgery)  DIAGNOSIS:  Encounter Diagnosis  Name Primary?   Malignant neoplasm of upper-outer quadrant of left breast in female, estrogen receptor positive ([place]) Yes    SUMMARY OF ONCOLOGIC HISTORY: Oncology History  Malignant neoplasm of upper-outer quadrant of left breast in female, estrogen receptor positive ([place])  02/21/2020 Initial Diagnosis   Patient presented to PCP with swollen, erythematous left breast and an inverted nipple. Mammogram and [place] on 02/01/20 showed two masses at the 2 o'clock position, 1.7cm and 0.7cm. Biopsy on 02/21/20 showed invasive ductal carcinoma, grade 2, HER-2 negative (1+), ER/PR+ 95%, KI67 15%.    02/21/2020 Cancer Staging   Staging form: Breast, AJCC 8th Edition - Clinical stage from 02/21/2020: Stage IA (cT1c, cN0, cM0, G2, ER+, PR+, HER2-) Stage prefix: Initial diagnosis    03/16/2020 Surgery   Left mastectomy (Dr. 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  (Personal history of breast cancer in 1999 for which she underwent a lumpectomy)   03/16/2020 Cancer Staging   Staging form: Breast, AJCC 8th Edition - Pathologic stage from 03/16/2020: Stage IA (pT1b, pN0, cM0, G2, ER+, PR+, HER2-) Histologic grading system: 3 grade system    03/2020 -  Anti-estrogen oral therapy   Anastrozole x 1 month, changed to Tamoxifen due to BP issues     CHIEF COMPLIANT: Left breast cancer on Tamoxifen  INTERVAL HISTORY: Sarah Barrett is a 82 yo with left breast cancer She states that her health is well. She did have surgery on scar tissue with Dr. Marla Roe.  She is recovering from that surgery.  We do not have any pathology report from that. She is tolerating the  tamoxifen extremely well with no complaints or concerns.  ALLERGIES:  is allergic to morphine and related and prednisone.  MEDICATIONS:  Current Outpatient Medications  Medication Sig Dispense Refill   Calcium Carb-Cholecalciferol (CALCIUM 600+D3 PO) Take 1 tablet by mouth daily.     Flaxseed, Linseed, (FLAXSEED OIL) 1000 MG CAPS Take 1,000 mg by mouth daily.     Multiple Vitamin (MULTIVITAMIN WITH MINERALS) TABS tablet Take 1 tablet by mouth daily. One-A-Day for Women     olmesartan (BENICAR) 40 MG tablet Take 40 mg by mouth daily.     Omega-3 Fatty Acids (FISH OIL) 1000 MG CAPS Take 1,000 mg by mouth daily.     tamoxifen (NOLVADEX) 20 MG tablet Take 1 tablet (20 mg total) by mouth daily. 90 tablet 3   No current facility-administered medications for this visit.    PHYSICAL EXAMINATION: ECOG PERFORMANCE STATUS: 1 - Symptomatic but completely ambulatory  Vitals:   06/06/22 1130  BP: (!) 177/80  Pulse: 77  Resp: 18  SpO2: 97%   Filed Weights   06/06/22 1130  Weight: 152 lb 1 oz (69 kg)     LABORATORY DATA:  I have reviewed the data as listed    Latest Ref Rng & Units 06/06/2021   11:17 AM 02/04/2019    6:14 AM 10/12/2008    2:23 PM  CMP  Glucose 70 - 99 mg/dL 100  90  95   BUN 8 - 23 mg/dL '12  15  20   '$ Creatinine 0.44 - 1.00 mg/dL 0.67  0.60  0.84   Sodium 135 - 145 mmol/L  133  139  140   Potassium 3.5 - 5.1 mmol/L 4.0  4.0  4.3   Chloride 98 - 111 mmol/L 101  103  105   CO2 22 - 32 mmol/L 29   26   Calcium 8.9 - 10.3 mg/dL 9.5   8.9   Total Protein 6.5 - 8.1 g/dL 6.9   7.0   Total Bilirubin 0.3 - 1.2 mg/dL 0.3   0.3   Alkaline Phos 38 - 126 U/L 28   74   AST 15 - 41 U/L 19   14   ALT 0 - 44 U/L 18   11     Lab Results  Component Value Date   WBC 5.5 06/06/2021   HGB 12.9 06/06/2021   HCT 39.4 06/06/2021   MCV 90.2 06/06/2021   PLT 148 (L) 06/06/2021   NEUTROABS 2.7 06/06/2021    ASSESSMENT & PLAN:  Malignant neoplasm of upper-outer quadrant of left  breast in female, estrogen receptor positive ([place]) 03/16/2020: Left mastectomy (Dr. 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  (Personal history of breast cancer in 1999 for which she underwent a lumpectomy) T1b N0 stage Ia   Current Treatment: Anastrozole 1 mg daily started 03/2020 changed to Tamoxifen Toxicities: Leg cramps   Hypertension: Much improved   Breast Cancer Surveillance:  11/08/2021: mammogram: benign, density category B 06/06/2022: Breast exam: Benign    CT lumbar spine 01/07/2022: Benign 04/01/2022: Ultrasound right upper extremity: Benign 06/04/2022: Dr. Marla Roe performed scar tissue surgery on the left breast.  RTC in 1 year   No orders of the defined types were placed in this encounter.  The patient has a good understanding of the overall plan. she agrees with it. she will call with any problems that may develop before the next visit here. Total time spent: 30 mins including face to face time and time spent for planning, charting and co-ordination of care   Sarah Ohara, MD 06/06/22    I Gardiner Coins am acting as a Education administrator for [employer]  I have reviewed the above documentation for accuracy and completeness, and I agree with the above.

## 2022-06-06 NOTE — Assessment & Plan Note (Addendum)
03/16/2020: Left mastectomy (Dr. 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  (Personal history of breast cancer in 1999 for which she underwent a lumpectomy) T1b N0 stage Ia   Current Treatment: Anastrozole 1 mg daily started 03/2020 changed to Tamoxifen Toxicities: Leg cramps   Hypertension: Much improved   Breast Cancer Surveillance:  11/08/2021: mammogram: benign, density category B 06/06/2022: Breast exam: Benign    CT lumbar spine 01/07/2022: Benign 04/01/2022: Ultrasound right upper extremity: Benign 06/04/2022: Dr. Marla Roe perform scar tissue surgery on the left breast.  RTC in 1 year

## 2022-06-07 ENCOUNTER — Ambulatory Visit (INDEPENDENT_AMBULATORY_CARE_PROVIDER_SITE_OTHER): Payer: Medicare HMO | Admitting: Surgical

## 2022-06-07 DIAGNOSIS — Z170 Estrogen receptor positive status [ER+]: Secondary | ICD-10-CM

## 2022-06-07 DIAGNOSIS — N6489 Other specified disorders of breast: Secondary | ICD-10-CM

## 2022-06-07 DIAGNOSIS — Z9012 Acquired absence of left breast and nipple: Secondary | ICD-10-CM

## 2022-06-07 DIAGNOSIS — C50412 Malignant neoplasm of upper-outer quadrant of left female breast: Secondary | ICD-10-CM

## 2022-06-07 NOTE — Progress Notes (Signed)
The patient is an 82 year old female here for follow-up after excision of left breast excess tissue and liposuction with contracture release with Dr. Marla Roe on 05/30/2022.  She is 8 days postop.  Patient presents and reports she is overall doing well. She has some questions about if the scarring will return or if she needs to massage the area with lotion.  She is not having any infectious symptoms.  Chaperone present on exam Patient is well-developed, well-nourished, no acute distress, breathing is unlabored.  On exam left breast/mastectomy flap incision is intact, healing well.  There is ecchymosis and swelling as expected after liposuction.  I do not appreciate any erythema or cellulitic changes.  No subcutaneous fluid collections noted.  A/P:  Commend continue with compressive garments, continue to avoid strenuous activities or heavy lifting.  Mepilex border dressing was replaced.  All of her questions were answered to her content, follow-up scheduled for 06/21/2022.  I do not see any signs of concern on exam.  I also spoke with patient's daughter via telephone during today's encounter.

## 2022-06-21 ENCOUNTER — Encounter: Payer: Medicare HMO | Admitting: Surgical

## 2022-07-02 ENCOUNTER — Ambulatory Visit (INDEPENDENT_AMBULATORY_CARE_PROVIDER_SITE_OTHER): Payer: Medicare HMO | Admitting: Surgical

## 2022-07-02 ENCOUNTER — Encounter: Payer: Self-pay | Admitting: Surgical

## 2022-07-02 VITALS — BP 195/78 | HR 89

## 2022-07-02 DIAGNOSIS — N6489 Other specified disorders of breast: Secondary | ICD-10-CM

## 2022-07-02 DIAGNOSIS — Z9012 Acquired absence of left breast and nipple: Secondary | ICD-10-CM

## 2022-07-02 DIAGNOSIS — Z170 Estrogen receptor positive status [ER+]: Secondary | ICD-10-CM

## 2022-07-02 NOTE — Progress Notes (Signed)
Referring Provider Collene Leyden, MD [address] [place],  [place] [address]   CC:  Chief Complaint  Patient presents with   Post-op Follow-up      Sarah Barrett is an 82 y.o. female.  HPI: Patient is an 82 year old female here for follow-up after excision of left breast excess tissue and liposuction with scar contracture release with Dr. Marla Roe on 05/30/2022.  She is 4.5 weeks postop.  She reports she is doing very well today, has not had any issues with her left breast.  Of note she does have radiation to the left breast.  She has her daughter on the phone during the visit.  Patient reports that she has noticed some lower extremity swelling of her bilateral feet, slightly extending upwards to her distal calf.  She does spend a lot of time sitting sewing, but also reports that she has been walking daily about 15 to 20 minutes.  She also has some elevated blood pressure today, 195/78, checked multiple times.  She denies any cardiac or pulmonary symptoms.  Denies any vision changes headaches, dizziness, fatigue.  She reports she did take her blood pressure medicine this morning.  She feels well and does not note any abnormal symptomatic changes.  She does report that she is scheduled to see her primary care in 2 days for a yearly exam.  Review of Systems General: No fevers or chills, no fatigue, denies headache or vision changes Cardio: Denies chest pain, weakness Pulmonary: Denies shortness of breath  Physical Exam    07/02/2022   11:14 AM 06/06/2022   11:30 AM 05/30/2022    9:47 AM  Vitals with BMI  Weight  152 lbs 1 oz   BMI  99991111   Systolic 0000000 [id number]  Diastolic 78 80 65  Pulse 89 77 88    General:  No acute distress,  Alert and oriented, Non-Toxic, Normal speech and affect Left breast: Left breast incision is intact and well-healed.  She does have some radiation skin changes noted.  There is no erythema or cellulitic changes noted.  There is no foul odor noted.  No  subcutaneous fluid collection noted.  Bilateral lower extremity: Bilateral lower extremity swelling noted, some mild pitting edema of the bilateral dorsal aspect of her feet.  She has some swelling extending into the distal calf, no tenderness to palpation.  No overlying skin changes.  There is no erythema.  Pedal pulses difficult to palpate, however distal extremities warm to touch, good cap refill noted.  Assessment/Plan In regards to her left breast, she is healing well.  She is 4.5 weeks postop.  There is no signs of infection or concern on exam.  We discussed scheduling a follow-up in 1 year for reevaluation.  Patient was agreeable to this.  She is aware to continue with compressive garments for 1 more week, can increase activity slowly over the next week. Recommend continuing with ambulation.  In regards to her elevated blood pressure, she is asymptomatic.  She took her BP medication this a.m.  She has an appointment with her PCP in 2 days for yearly exam, I encouraged patient to discuss elevated blood pressure with PCP at that time.  I also offered the option of seeing if she can move her appointment up to tomorrow with PCP, but patient feels comfortable waiting until Thursday.  I think this is reasonable given she is asymptomatic and feeling well.  In regards to her lower extremity swelling, possibly  postsurgical related due to fluid shift with liposuction and the use of tumescent.  However, she is 4 weeks postop.  I encouraged the patient to wear compression stockings, elevate her lower extremities.  I do not believe this to be related to VTE given bilateral swelling, no overlying skin changes, timeline postoperatively and her activity level.  She does report that she has been walking every day for at least 15 to 20 minutes.  She is very active otherwise.  She does sit and so quite a lot so this may be contributing to this.  She has a follow-up with her PCP in 2 days, encouraged her to discuss  this with PCP there may be multiple etiologies.  Patient was understanding of this.  We discussed reasons to be evaluated sooner than her appointment with PCP in 2 days, patient was agreeable.  Discussed all of the recommendations with patient and her daughter Mateo Flow who was present virtually during today's encounter.  Carola Rhine [place] Peifer 07/02/2022, 12:35 PM

## 2022-07-04 DIAGNOSIS — C50412 Malignant neoplasm of upper-outer quadrant of left female breast: Secondary | ICD-10-CM | POA: Diagnosis not present

## 2022-07-04 DIAGNOSIS — E785 Hyperlipidemia, unspecified: Secondary | ICD-10-CM | POA: Diagnosis not present

## 2022-07-04 DIAGNOSIS — Z0000 Encounter for general adult medical examination without abnormal findings: Secondary | ICD-10-CM | POA: Diagnosis not present

## 2022-07-04 DIAGNOSIS — M8588 Other specified disorders of bone density and structure, other site: Secondary | ICD-10-CM | POA: Diagnosis not present

## 2022-07-04 DIAGNOSIS — I10 Essential (primary) hypertension: Secondary | ICD-10-CM | POA: Diagnosis not present

## 2022-08-06 DIAGNOSIS — R3911 Hesitancy of micturition: Secondary | ICD-10-CM | POA: Diagnosis not present

## 2022-08-06 DIAGNOSIS — I10 Essential (primary) hypertension: Secondary | ICD-10-CM | POA: Diagnosis not present

## 2022-09-03 DIAGNOSIS — C50412 Malignant neoplasm of upper-outer quadrant of left female breast: Secondary | ICD-10-CM | POA: Diagnosis not present

## 2022-09-06 DIAGNOSIS — E785 Hyperlipidemia, unspecified: Secondary | ICD-10-CM | POA: Diagnosis not present

## 2022-10-21 DIAGNOSIS — R609 Edema, unspecified: Secondary | ICD-10-CM | POA: Diagnosis not present

## 2022-10-21 DIAGNOSIS — I10 Essential (primary) hypertension: Secondary | ICD-10-CM | POA: Diagnosis not present

## 2022-11-14 DIAGNOSIS — Z1231 Encounter for screening mammogram for malignant neoplasm of breast: Secondary | ICD-10-CM | POA: Diagnosis not present

## 2022-12-09 DIAGNOSIS — R399 Unspecified symptoms and signs involving the genitourinary system: Secondary | ICD-10-CM | POA: Diagnosis not present

## 2022-12-09 DIAGNOSIS — R8290 Unspecified abnormal findings in urine: Secondary | ICD-10-CM | POA: Diagnosis not present

## 2022-12-09 DIAGNOSIS — I10 Essential (primary) hypertension: Secondary | ICD-10-CM | POA: Diagnosis not present

## 2022-12-30 DIAGNOSIS — M47816 Spondylosis without myelopathy or radiculopathy, lumbar region: Secondary | ICD-10-CM | POA: Diagnosis not present

## 2023-01-06 DIAGNOSIS — R319 Hematuria, unspecified: Secondary | ICD-10-CM | POA: Diagnosis not present

## 2023-01-06 DIAGNOSIS — Z961 Presence of intraocular lens: Secondary | ICD-10-CM | POA: Diagnosis not present

## 2023-01-06 DIAGNOSIS — H43811 Vitreous degeneration, right eye: Secondary | ICD-10-CM | POA: Diagnosis not present

## 2023-01-06 DIAGNOSIS — I10 Essential (primary) hypertension: Secondary | ICD-10-CM | POA: Diagnosis not present

## 2023-01-06 DIAGNOSIS — H26491 Other secondary cataract, right eye: Secondary | ICD-10-CM | POA: Diagnosis not present

## 2023-01-06 DIAGNOSIS — C50412 Malignant neoplasm of upper-outer quadrant of left female breast: Secondary | ICD-10-CM | POA: Diagnosis not present

## 2023-01-06 DIAGNOSIS — H04123 Dry eye syndrome of bilateral lacrimal glands: Secondary | ICD-10-CM | POA: Diagnosis not present

## 2023-01-06 DIAGNOSIS — N399 Disorder of urinary system, unspecified: Secondary | ICD-10-CM | POA: Diagnosis not present

## 2023-01-06 DIAGNOSIS — H35371 Puckering of macula, right eye: Secondary | ICD-10-CM | POA: Diagnosis not present

## 2023-01-21 DIAGNOSIS — I10 Essential (primary) hypertension: Secondary | ICD-10-CM | POA: Diagnosis not present

## 2023-01-21 DIAGNOSIS — R319 Hematuria, unspecified: Secondary | ICD-10-CM | POA: Diagnosis not present

## 2023-02-03 ENCOUNTER — Other Ambulatory Visit: Payer: Self-pay | Admitting: Family Medicine

## 2023-02-03 ENCOUNTER — Ambulatory Visit
Admission: RE | Admit: 2023-02-03 | Discharge: 2023-02-03 | Disposition: A | Discharge status: Home / Self Care | Payer: Medicare HMO | Source: Ambulatory Visit | Attending: Family Medicine | Admitting: Family Medicine

## 2023-02-03 DIAGNOSIS — M25532 Pain in left wrist: Secondary | ICD-10-CM

## 2023-02-03 DIAGNOSIS — S0012XA Contusion of left eyelid and periocular area, initial encounter: Secondary | ICD-10-CM | POA: Diagnosis not present

## 2023-02-03 DIAGNOSIS — W19XXXA Unspecified fall, initial encounter: Secondary | ICD-10-CM | POA: Diagnosis not present

## 2023-02-07 DIAGNOSIS — S62305A Unspecified fracture of fourth metacarpal bone, left hand, initial encounter for closed fracture: Secondary | ICD-10-CM | POA: Diagnosis not present

## 2023-02-28 DIAGNOSIS — S62344D Nondisplaced fracture of base of fourth metacarpal bone, right hand, subsequent encounter for fracture with routine healing: Secondary | ICD-10-CM | POA: Diagnosis not present

## 2023-02-28 DIAGNOSIS — S62345D Nondisplaced fracture of base of fourth metacarpal bone, left hand, subsequent encounter for fracture with routine healing: Secondary | ICD-10-CM | POA: Diagnosis not present

## 2023-03-01 DIAGNOSIS — Z23 Encounter for immunization: Secondary | ICD-10-CM | POA: Diagnosis not present

## 2023-03-05 ENCOUNTER — Encounter (INDEPENDENT_AMBULATORY_CARE_PROVIDER_SITE_OTHER): Payer: Self-pay

## 2023-03-10 ENCOUNTER — Encounter (INDEPENDENT_AMBULATORY_CARE_PROVIDER_SITE_OTHER): Payer: Self-pay

## 2023-03-10 ENCOUNTER — Ambulatory Visit (INDEPENDENT_AMBULATORY_CARE_PROVIDER_SITE_OTHER): Payer: Medicare HMO

## 2023-03-10 ENCOUNTER — Ambulatory Visit (INDEPENDENT_AMBULATORY_CARE_PROVIDER_SITE_OTHER): Payer: Medicare HMO | Admitting: Audiology

## 2023-03-10 VITALS — Ht 59.0 in | Wt 152.0 lb

## 2023-03-10 DIAGNOSIS — H903 Sensorineural hearing loss, bilateral: Secondary | ICD-10-CM | POA: Diagnosis not present

## 2023-03-10 DIAGNOSIS — H6123 Impacted cerumen, bilateral: Secondary | ICD-10-CM | POA: Diagnosis not present

## 2023-03-10 NOTE — Progress Notes (Unsigned)
Patient ID: Sarah Barrett, female   DOB: 1940-06-21, 82 y.o.   MRN: [id number]  Follow-up: Hearing loss, recurrent cerumen impaction  HPI: The patient is an 82 year old female who returns today for her follow-up evaluation.  She was last seen 1 year ago.  At that time, she was noted to have bilateral high-frequency sensorineural hearing loss, worse on the right side at low frequencies.  She also had bilateral cerumen impaction.

## 2023-03-10 NOTE — Progress Notes (Signed)
[address] [place], [place] [address] [phone]  Audiological Evaluation    Name: Sarah Barrett     DOB:   1940-12-31      MRN:   [id number]                                                                                     Service Date: 03/10/2023       Patient was referred today for a hearing evaluation by Dr. Karle Barr.   Symptoms Yes Details  Hearing loss  [x]  Patient reported perceiving hearing loss in both ears, where the right ear is worse than the left ear.  Tinnitus  []  Patient denied experiencing tinnitus.  Balance problems  []  Patient denied vertigo sensations.  Previous ear surgeries  []  Patient denied any previous ear surgeries.  Amplification  [x]  Patient reported use of bilateral hearing aids.    Tympanogram: Right ear: Normal external ear canal volume with normal middle ear pressure and tympanic membrane compliance (Type A). Left ear: Normal external ear canal volume with normal middle ear pressure and tympanic membrane compliance (Type A).   Pure tone Audiometry: The audiogram was completed using conventional audiometric techniques under headphones with good reliability.   The hearing test results indicate: Right ear: Mild hearing loss at 250 Hz gradually sloping to severe sensorineural hearing loss from [phone] Hz. Left ear: Normal hearing sensitivity at 250 Hz gradually sloping to profound sensorineural hearing loss from [phone] Hz.   Speech Audiometry: Right ear- Speech Reception Threshold (SRT) was obtained at 55 dBHL masked. Left ear- Speech Reception Threshold (SRT) was obtained at 45 dBHL.   Word Recognition Score Tested using NU-6 (MLV) Right ear: 80% was obtained at a presentation level of 75 dBHL with contralateral masking which is deemed as good  understanding. Left ear: 80% was obtained at a presentation level of 75 dBHL which is deemed as  good  understanding.    Impression: There is not a significant difference between  the word recognition scores between the ears.    Recommendations: Repeat audiogram when changes are perceived or per MD. Continue use of hearing aids.   Conley Rolls Orla Estrin, AUD, CCC-A 03/10/23

## 2023-03-11 DIAGNOSIS — R3914 Feeling of incomplete bladder emptying: Secondary | ICD-10-CM | POA: Diagnosis not present

## 2023-03-11 DIAGNOSIS — H6123 Impacted cerumen, bilateral: Secondary | ICD-10-CM | POA: Insufficient documentation

## 2023-03-11 DIAGNOSIS — R3915 Urgency of urination: Secondary | ICD-10-CM | POA: Diagnosis not present

## 2023-03-11 DIAGNOSIS — H903 Sensorineural hearing loss, bilateral: Secondary | ICD-10-CM | POA: Insufficient documentation

## 2023-03-11 DIAGNOSIS — R350 Frequency of micturition: Secondary | ICD-10-CM | POA: Diagnosis not present

## 2023-04-04 ENCOUNTER — Other Ambulatory Visit: Payer: Self-pay

## 2023-04-04 ENCOUNTER — Encounter (HOSPITAL_COMMUNITY): Payer: Self-pay | Admitting: Emergency Medicine

## 2023-04-04 ENCOUNTER — Emergency Department (HOSPITAL_COMMUNITY)
Admission: EM | Admit: 2023-04-04 | Discharge: 2023-04-05 | Disposition: A | Discharge status: Home / Self Care | Payer: Medicare HMO | Attending: Student | Admitting: Student

## 2023-04-04 DIAGNOSIS — R519 Headache, unspecified: Secondary | ICD-10-CM | POA: Diagnosis not present

## 2023-04-04 DIAGNOSIS — I10 Essential (primary) hypertension: Secondary | ICD-10-CM | POA: Diagnosis not present

## 2023-04-04 DIAGNOSIS — Z1152 Encounter for screening for COVID-19: Secondary | ICD-10-CM | POA: Insufficient documentation

## 2023-04-04 DIAGNOSIS — Z853 Personal history of malignant neoplasm of breast: Secondary | ICD-10-CM | POA: Insufficient documentation

## 2023-04-04 DIAGNOSIS — Z79899 Other long term (current) drug therapy: Secondary | ICD-10-CM | POA: Insufficient documentation

## 2023-04-04 LAB — URINALYSIS, ROUTINE W REFLEX MICROSCOPIC
Bilirubin Urine: NEGATIVE
Glucose, UA: NEGATIVE mg/dL
Ketones, ur: NEGATIVE mg/dL
Nitrite: NEGATIVE
Protein, ur: NEGATIVE mg/dL
Specific Gravity, Urine: 1.008 (ref 1.005–1.030)
pH: 7 (ref 5.0–8.0)

## 2023-04-04 LAB — CBC
HCT: 41.7 % (ref 36.0–46.0)
Hemoglobin: 13.7 g/dL (ref 12.0–15.0)
MCH: 30.2 pg (ref 26.0–34.0)
MCHC: 32.9 g/dL (ref 30.0–36.0)
MCV: 92.1 fL (ref 80.0–100.0)
Platelets: 172 10*3/uL (ref 150–400)
RBC: 4.53 MIL/uL (ref 3.87–5.11)
RDW: 14.1 % (ref 11.5–15.5)
WBC: 7 10*3/uL (ref 4.0–10.5)
nRBC: 0 % (ref 0.0–0.2)

## 2023-04-04 LAB — BASIC METABOLIC PANEL
Anion gap: 6 (ref 5–15)
BUN: 13 mg/dL (ref 8–23)
CO2: 25 mmol/L (ref 22–32)
Calcium: 9 mg/dL (ref 8.9–10.3)
Chloride: 103 mmol/L (ref 98–111)
Creatinine, Ser: 0.57 mg/dL (ref 0.44–1.00)
GFR, Estimated: >60 mL/min (ref >60)
Glucose, Bld: 109 mg/dL — ABNORMAL HIGH (ref 70–99)
Potassium: 4 mmol/L (ref 3.5–5.1)
Sodium: 134 mmol/L — ABNORMAL LOW (ref 135–145)

## 2023-04-04 LAB — CBG MONITORING, ED: Glucose-Capillary: 170 mg/dL — ABNORMAL HIGH (ref 70–99)

## 2023-04-04 MED ORDER — IRBESARTAN 300 MG PO TABS
300.0000 mg | ORAL_TABLET | Freq: Every day | ORAL | Status: DC
  Administered 2023-04-04: 300 mg via ORAL
  Filled 2023-04-04: qty 1

## 2023-04-04 MED ORDER — KETOROLAC TROMETHAMINE 15 MG/ML IJ SOLN
15.0000 mg | Freq: Once | INTRAMUSCULAR | Status: AC
  Administered 2023-04-04: 15 mg via INTRAMUSCULAR
  Filled 2023-04-04: qty 1

## 2023-04-04 NOTE — ED Triage Notes (Signed)
Patient arrives in wheelchair by POV c/o hypertension and headache today. Patient states her BP only gets high when she is sick. Reports chills and sore throat yesterday. No neuro symptoms in triage. States she took her medications about 7:30 this morning.

## 2023-04-05 LAB — SARS CORONAVIRUS 2 BY RT PCR: SARS Coronavirus 2 by RT PCR: NEGATIVE

## 2023-04-05 LAB — GROUP A STREP BY PCR: Group A Strep by PCR: NOT DETECTED

## 2023-04-05 MED ORDER — CARVEDILOL 6.25 MG PO TABS: 6.2500 mg | ORAL_TABLET | Freq: Two times a day (BID) | ORAL | 0 refills | Status: DC

## 2023-04-05 NOTE — ED Provider Notes (Signed)
[place] EMERGENCY DEPARTMENT AT [facility] Provider Note  CSN: [id number] Arrival date & time: 04/04/23 1739  Chief Complaint(s) Headache and Hypertension  HPI Sarah Barrett is a 82 y.o. female with PMH breast cancer, HTN who presents emergency room for evaluation of headache and hypertension.  Patient states that her husband had a viral URI last week and that she has had a headache with facial pressure and sore throat today.  She took her blood pressure at home and found systolics greater than 200 and came to the ER for further evaluation.  Denies any associated numbness, tingling, weakness, visual changes or other neurologic complaints.  Denies chest pain, shortness of breath, abdominal pain, nausea, vomiting or other systemic symptoms.  Describes the headache as mild, 4 out of 10 and in a bandlike distribution around the head.  Gradual in onset.   Past Medical History Past Medical History:  Diagnosis Date   Arm mass, right    Cancer [facility])    breast   History of bilateral breast cancer released from oncologist-- dr Donnie Coffin 2010, (02-01-2019 per pt no recurrence)   dx right side 1999 s/p  breast lumpectomy and IMRT;  dx left side 2000 s/p breast lumpectomy and completed IMRT 2000   Hypertension    OA (osteoarthritis)    Shoulder mass    right   Wears hearing aid in both ears    Wears partial dentures    upper   Patient Active Problem List   Diagnosis Date Noted   Sensorineural hearing loss, 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 Authorizing Provider  carvedilol (COREG) 6.25 MG tablet Take 1 tablet (6.25 mg total) by mouth 2 (two) times daily with a meal. 04/05/23 05/05/23 Yes Shraddha Lebron, MD  Calcium Carb-Cholecalciferol (CALCIUM 600+D3 PO) Take 1 tablet by mouth daily.    [provider]  Flaxseed, Linseed, (FLAXSEED OIL) 1000 MG CAPS Take 1,000 mg by mouth daily.    [provider]  Multiple Vitamin (MULTIVITAMIN WITH MINERALS) TABS tablet Take 1 tablet by mouth daily. One-A-Day for Women    [provider]  olmesartan (BENICAR) 40 MG tablet Take 40 mg by mouth daily. 01/31/22   [provider]  Omega-3 Fatty Acids (FISH OIL) 1000 MG CAPS Take 1,000 mg by mouth daily.    [provider]  tamoxifen (NOLVADEX) 20 MG tablet Take 1 tablet (20 mg total) by mouth daily. 06/06/22   Serena Croissant, MD  Past Surgical History Past Surgical History:  Procedure Laterality Date   ABDOMINAL HYSTERECTOMY     BREAST CYST EXCISION Left 05/30/2022   Procedure: Left breast tissue excision and lipo;  Surgeon: Peggye Form, DO;  Location: [facility];  Service: Plastics;  Laterality: Left;   BREAST LUMPECTOMY Bilateral right 1999; left 04/ 2000   CATARACT EXTRACTION W/ INTRAOCULAR LENS  IMPLANT, BILATERAL  yrs ago   COLONOSCOPY     KNEE ARTHROSCOPY W/ MENISCAL REPAIR Right 2018   LESION EXCISION WITH COMPLEX REPAIR Bilateral 03/11/2019   Procedure: Bilateral excision of eye lid xanthelasma;  Surgeon: Peggye Form, DO;  Location: [facility];  Service: Plastics;  Laterality: Bilateral;  1 hour, please   LIPOSUCTION Left 05/30/2022   Procedure: LIPOSUCTION;  Surgeon: Peggye Form, DO;  Location: [facility];   Service: Plastics;  Laterality: Left;   LUMBAR FUSION  07-07-2001   dr Channing Mutters @[facility]    L5 --S1    MASS EXCISION N/A 02/04/2019   Procedure: EXCISION OF RIGHT SHOULDER SKIN MASS AND LEFT ARM SKIN MASS;  Surgeon: Sheliah Hatch, De Blanch, MD;  Location: [facility];  Service: General;  Laterality: N/A;   REMOVAL OF URINARY SLING  12-20-2003   dr Annabell Howells @[facility]    SCAR REVISION N/A 07/21/2019   Procedure: Excision and release of abdominal wall contracture;  Surgeon: Peggye Form, DO;  Location: [facility];  Service: Plastics;  Laterality: N/A;  2 hours   SIMPLE MASTECTOMY WITH AXILLARY SENTINEL NODE BIOPSY Left 03/16/2020   Procedure: LEFT MASTECTOMY;  Surgeon: Emelia Loron, MD;  Location: [facility];  Service: General;  Laterality: Left;  PEC BLOCK   TOTAL ABDOMINAL HYSTERECTOMY W/ BILATERAL SALPINGOOPHORECTOMY  1995   TRANSOBTURATOR SLING  08/2003   WISDOM TOOTH EXTRACTION     Family History Family History  Problem Relation Age of Onset   Breast cancer Mother    Breast cancer Maternal Grandmother    Cancer Sister    Cervical cancer Maternal Aunt    Colon cancer Neg Hx    Rectal cancer Neg Hx    Stomach cancer Neg Hx     Social History Social History   Tobacco Use   Smoking status: Never   Smokeless tobacco: Never  Vaping Use   Vaping status: Never Used  Substance Use Topics   Alcohol use: No   Drug use: Never   Allergies Morphine and codeine and Prednisone  Review of Systems Review of Systems  HENT:  Positive for sore throat.   Neurological:  Positive for headaches.    Physical Exam Vital Signs  I have reviewed the triage vital signs BP (!) 185/85   Pulse 93   Temp 98.2 F (36.8 C) (Oral)   Resp 16   Ht 4\' 11"  (1.499 m)   Wt 68.9 kg   LMP  (LMP Unknown)   SpO2 95%   BMI 30.70 kg/m   Physical Exam Vitals and nursing note reviewed.  Constitutional:      General: She is not in acute distress.    Appearance: She is well-developed.   HENT:     Head: Normocephalic and atraumatic.     Mouth/Throat:     Pharynx: Posterior oropharyngeal erythema present.  Eyes:     Conjunctiva/sclera: Conjunctivae normal.  Cardiovascular:     Rate and Rhythm: Normal rate and regular rhythm.     Heart sounds: No murmur heard. Pulmonary:  Effort: Pulmonary effort is normal. No respiratory distress.     Breath sounds: Normal breath sounds.  Abdominal:     Palpations: Abdomen is soft.     Tenderness: There is no abdominal tenderness.  Musculoskeletal:        General: No swelling.     Cervical back: Neck supple.  Skin:    General: Skin is warm and dry.     Capillary Refill: Capillary refill takes less than 2 seconds.  Neurological:     Mental Status: She is alert.     Cranial Nerves: No cranial nerve deficit.     Sensory: No sensory deficit.     Motor: No weakness.  Psychiatric:        Mood and Affect: Mood normal.     ED Results and Treatments Labs (all labs ordered are listed, but only abnormal results are displayed) Labs Reviewed  BASIC METABOLIC PANEL - Abnormal; Notable for the following components:      Result Value   Sodium 134 (*)    Glucose, Bld 109 (*)    All other components within normal limits  URINALYSIS, ROUTINE W REFLEX MICROSCOPIC - Abnormal; Notable for the following components:   Color, Urine STRAW (*)    Hgb urine dipstick SMALL (*)    Leukocytes,Ua TRACE (*)    Bacteria, UA RARE (*)    Non Squamous Epithelial 0-5 (*)    All other components within normal limits  CBG MONITORING, ED - Abnormal; Notable for the following components:   Glucose-Capillary 170 (*)    All other components within normal limits  SARS CORONAVIRUS 2 BY RT PCR  GROUP A STREP BY PCR  CBC                                                                                                                          Radiology No results found.  Pertinent labs & imaging results that were available during my care of the patient  were reviewed by me and considered in my medical decision making (see MDM for details).  Medications Ordered in ED Medications  irbesartan (AVAPRO) tablet 300 mg (300 mg Oral Given 04/04/23 2346)  ketorolac (TORADOL) 15 MG/ML injection 15 mg (15 mg Intramuscular Given 04/04/23 2346)  Procedures Procedures  (including critical care time)  Medical Decision Making / ED Course   This patient presents to the ED for concern of headache, sore throat, hypertension, this involves an extensive number of treatment options, and is a complaint that carries with it a high risk of complications and morbidity.  The differential diagnosis includes viral URI, COVID-19,  strep, viral pharyngitis, hypertensive urgency, hypertensive emergency  MDM: Patient seen emergency room for evaluation of headache, sore throat and high blood pressure.  Physical exam with some mild oropharyngeal erythema but is otherwise unremarkable.  Neurologic exam unremarkable with no focal motor or sensory deficits, no cranial nerve deficits.  No nystagmus and normal gait.  Laboratory evaluation largely unremarkable outside of a very mild hyponatremia 134.  Creatinine is normal.  Urinalysis with no significant proteinuria, trace leuk esterase, 6-10 white blood cells and rare bacteria but patient is not complaining of any urinary symptoms and we will defer antibiotics at this time.  Strep negative, COVID-negative.  As patient has a completely normal neurologic exam, headache gradual in onset and in a bandlike distribution in the setting of sore throat and a recent sick contact, I have very low suspicion for acute intracranial pathology and we will defer CT imaging at this time.  Patient given Toradol for the headache with symptomatic improvement.  I restarted the patient's blood pressure medicines and did not see  significant change in the blood pressure.  Current ER literature advises against aggressive blood pressure control in the emergency department in the absence of life-threatening symptoms as this is not shown to have clinical benefit and should be managed in the outpatient setting Archie Balboa et al 2023 Bress et al 2024).  Patient reportedly has intolerances to multiple other blood pressure medications including amlodipine causing lower extremity swelling.  Will start low-dose Coreg and patient will follow-up outpatient with her PCP to discuss changes to her outpatient blood pressure regimen.  Patient states that her normal blood pressure is in the high 160s at baseline and her blood pressure today of 185/85 is not significantly off of this baseline.  Very low suspicion for hypertensive emergency at this time and patient has no evidence of endorgan damage.  Given return precautions of which she voiced understanding.  At this time she does not meet inpatient criteria for admission and will discharge with outpatient follow-up.    Additional history obtained: -Additional history obtained from husband -External records from outside source obtained and reviewed including: Chart review including previous notes, labs, imaging, consultation notes   Lab Tests: -I ordered, reviewed, and interpreted labs.   The pertinent results include:   Labs Reviewed  BASIC METABOLIC PANEL - Abnormal; Notable for the following components:      Result Value   Sodium 134 (*)    Glucose, Bld 109 (*)    All other components within normal limits  URINALYSIS, ROUTINE W REFLEX MICROSCOPIC - Abnormal; Notable for the following components:   Color, Urine STRAW (*)    Hgb urine dipstick SMALL (*)    Leukocytes,Ua TRACE (*)    Bacteria, UA RARE (*)    Non Squamous Epithelial 0-5 (*)    All other components within normal limits  CBG MONITORING, ED - Abnormal; Notable for the following components:   Glucose-Capillary 170 (*)    All  other components within normal limits  SARS CORONAVIRUS 2 BY RT PCR  GROUP A STREP BY PCR  CBC       Medicines ordered and prescription  drug management: Meds ordered this encounter  Medications   irbesartan (AVAPRO) tablet 300 mg   ketorolac (TORADOL) 15 MG/ML injection 15 mg   carvedilol (COREG) 6.25 MG tablet    Sig: Take 1 tablet (6.25 mg total) by mouth 2 (two) times daily with a meal.    Dispense:  60 tablet    Refill:  0    -I have reviewed the patients home medicines and have made adjustments as needed  Critical interventions none   Cardiac Monitoring: The patient was maintained on a cardiac monitor.  I personally viewed and interpreted the cardiac monitored which showed an underlying rhythm of: NSR  Social Determinants of Health:  Factors impacting patients care include: none   Reevaluation: After the interventions noted above, I reevaluated the patient and found that they have :improved  Co morbidities that complicate the patient evaluation  Past Medical History:  Diagnosis Date   Arm mass, right    Cancer [facility])    breast   History of bilateral breast cancer released from oncologist-- dr Donnie Coffin 2010, (02-01-2019 per pt no recurrence)   dx right side 1999 s/p  breast lumpectomy and IMRT;  dx left side 2000 s/p breast lumpectomy and completed IMRT 2000   Hypertension    OA (osteoarthritis)    Shoulder mass    right   Wears hearing aid in both ears    Wears partial dentures    upper      Dispostion: I considered admission for this patient, but at this time she does not meet inpatient criteria for admission and will be discharged with outpatient follow-up     Final Clinical Impression(s) / ED Diagnoses Final diagnoses:  Uncontrolled hypertension     @PCDICTATION @    Glendora Score, MD 04/05/23 [phone]

## 2023-04-14 DIAGNOSIS — I10 Essential (primary) hypertension: Secondary | ICD-10-CM | POA: Diagnosis not present

## 2023-05-13 ENCOUNTER — Telehealth: Payer: Self-pay | Admitting: Hematology and Oncology

## 2023-05-22 DIAGNOSIS — R300 Dysuria: Secondary | ICD-10-CM | POA: Diagnosis not present

## 2023-05-22 DIAGNOSIS — R350 Frequency of micturition: Secondary | ICD-10-CM | POA: Diagnosis not present

## 2023-05-22 DIAGNOSIS — R3915 Urgency of urination: Secondary | ICD-10-CM | POA: Diagnosis not present

## 2023-05-23 DIAGNOSIS — E871 Hypo-osmolality and hyponatremia: Secondary | ICD-10-CM | POA: Diagnosis not present

## 2023-06-06 DIAGNOSIS — R300 Dysuria: Secondary | ICD-10-CM | POA: Diagnosis not present

## 2023-06-09 ENCOUNTER — Inpatient Hospital Stay: Payer: Medicare HMO | Admitting: Hematology and Oncology

## 2023-06-17 ENCOUNTER — Inpatient Hospital Stay: Payer: Medicare HMO | Attending: Hematology and Oncology | Admitting: Hematology and Oncology

## 2023-06-17 VITALS — BP 163/55 | HR 73 | Temp 98.4°F | Resp 20 | Ht 59.0 in | Wt 157.1 lb

## 2023-06-17 DIAGNOSIS — Z1721 Progesterone receptor positive status: Secondary | ICD-10-CM | POA: Diagnosis not present

## 2023-06-17 DIAGNOSIS — Z79810 Long term (current) use of selective estrogen receptor modulators (SERMs): Secondary | ICD-10-CM | POA: Insufficient documentation

## 2023-06-17 DIAGNOSIS — Z9012 Acquired absence of left breast and nipple: Secondary | ICD-10-CM | POA: Diagnosis not present

## 2023-06-17 DIAGNOSIS — C50412 Malignant neoplasm of upper-outer quadrant of left female breast: Secondary | ICD-10-CM | POA: Insufficient documentation

## 2023-06-17 DIAGNOSIS — Z170 Estrogen receptor positive status [ER+]: Secondary | ICD-10-CM | POA: Insufficient documentation

## 2023-06-17 DIAGNOSIS — Z79899 Other long term (current) drug therapy: Secondary | ICD-10-CM | POA: Insufficient documentation

## 2023-06-17 DIAGNOSIS — Z1732 Human epidermal growth factor receptor 2 negative status: Secondary | ICD-10-CM | POA: Diagnosis not present

## 2023-06-17 DIAGNOSIS — I10 Essential (primary) hypertension: Secondary | ICD-10-CM | POA: Insufficient documentation

## 2023-06-17 MED ORDER — METOPROLOL SUCCINATE ER 25 MG PO TB24: 25.0000 mg | ORAL_TABLET | Freq: Every day | ORAL | Status: AC

## 2023-06-17 NOTE — Progress Notes (Signed)
Patient Care Team: Irven Coe, MD as PCP - General (Family Medicine) Serena Croissant, MD as Consulting Physician (Hematology and Oncology) Emelia Loron, MD as Consulting Physician (General Surgery)  DIAGNOSIS:  Encounter Diagnosis  Name Primary?   Malignant neoplasm of upper-outer quadrant of left breast in female, estrogen receptor positive (HCC) Yes    SUMMARY OF ONCOLOGIC HISTORY: Oncology History  Malignant neoplasm of upper-outer quadrant of left breast in female, estrogen receptor positive (HCC)  02/21/2020 Initial Diagnosis   Patient presented to PCP with swollen, erythematous left breast and an inverted nipple. Mammogram and [place] on 02/01/20 showed two masses at the 2 o'clock position, 1.7cm and 0.7cm. Biopsy on 02/21/20 showed invasive ductal carcinoma, grade 2, HER-2 negative (1+), ER/PR+ 95%, KI67 15%.    02/21/2020 Cancer Staging   Staging form: Breast, AJCC 8th Edition - Clinical stage from 02/21/2020: Stage IA (cT1c, cN0, cM0, G2, ER+, PR+, HER2-) Stage prefix: Initial diagnosis    03/16/2020 Surgery   Left mastectomy (Dr. Dwain Sarna) [phone]): Multifocal invasive ductal carcinoma and DCIS, grade 2, 0.9cm, clear margins ER/PR 95% positive, HER-2 negative, Ki-67 15%.  (Personal history of breast cancer in 1999 for which she underwent a lumpectomy)   03/16/2020 Cancer Staging   Staging form: Breast, AJCC 8th Edition - Pathologic stage from 03/16/2020: Stage IA (pT1b, pN0, cM0, G2, ER+, PR+, HER2-) Histologic grading system: 3 grade system    03/2020 -  Anti-estrogen oral therapy   Anastrozole x 1 month, changed to Tamoxifen due to BP issues     CHIEF COMPLIANT: Follow-up on tamoxifen  HISTORY OF PRESENT ILLNESS:   History of Present Illness   The patient, with breast cancer, presents for a follow-up regarding her treatment with tamoxifen. She is accompanied by her daughter, who is on the phone during the visit.  She has been on tamoxifen since her  surgery in 2021, marking nearly three years of treatment. She experiences no side effects from tamoxifen, such as hot flashes. She has several bottles of tamoxifen at home, as she receives a three-month supply from [place].  Her recent mammogram results were good, and she has no pain or discomfort in her chest.  She mentions that her blood pressure was high today, and she is currently on metoprolol ER 25 mg, which was recently added to her regimen. She monitors her blood pressure at home, where it typically runs in the 140s to 150s.  Regarding physical activity, she notes that she has not been walking outside due to cold weather but does walk when the weather is nice. She has no swelling in her legs.         ALLERGIES:  is allergic to morphine and codeine and prednisone.  MEDICATIONS:  Current Outpatient Medications  Medication Sig Dispense Refill   metoprolol succinate (TOPROL-XL) 25 MG 24 hr tablet Take 1 tablet (25 mg total) by mouth daily.     Calcium Carb-Cholecalciferol (CALCIUM 600+D3 PO) Take 1 tablet by mouth daily.     olmesartan (BENICAR) 40 MG tablet Take 40 mg by mouth daily.     Omega-3 Fatty Acids (FISH OIL) 1000 MG CAPS Take 1,000 mg by mouth daily.     tamoxifen (NOLVADEX) 20 MG tablet Take 1 tablet (20 mg total) by mouth daily. 90 tablet 3   No current facility-administered medications for this visit.    PHYSICAL EXAMINATION: ECOG PERFORMANCE STATUS: 1 - Symptomatic but completely ambulatory  Vitals:   06/17/23 1517 06/17/23 1518  BP: (!) 175/64 Marland Kitchen)  163/55  Pulse: 73   Resp: 20   Temp: 98.4 F (36.9 C)    Filed Weights   06/17/23 1517  Weight: 157 lb 1.6 oz (71.3 kg)    Physical Exam   VITALS: BP- high, specific value not provided EXTREMITIES: No edema      (exam performed in the presence of a chaperone)  LABORATORY DATA:  I have reviewed the data as listed    Latest Ref Rng & Units 04/04/2023    6:22 PM 06/06/2021   11:17 AM 02/04/2019    6:14 AM   CMP  Glucose 70 - 99 mg/dL 161  096  90   BUN 8 - 23 mg/dL 13  12  15    Creatinine 0.44 - 1.00 mg/dL 0.45  4.09  8.11   Sodium 135 - 145 mmol/L 134  133  139   Potassium 3.5 - 5.1 mmol/L 4.0  4.0  4.0   Chloride 98 - 111 mmol/L 103  101  103   CO2 22 - 32 mmol/L 25  29    Calcium 8.9 - 10.3 mg/dL 9.0  9.5    Total Protein 6.5 - 8.1 g/dL  6.9    Total Bilirubin 0.3 - 1.2 mg/dL  0.3    Alkaline Phos 38 - 126 U/L  28    AST 15 - 41 U/L  19    ALT 0 - 44 U/L  18      Lab Results  Component Value Date   WBC 7.0 04/04/2023   HGB 13.7 04/04/2023   HCT 41.7 04/04/2023   MCV 92.1 04/04/2023   PLT 172 04/04/2023   NEUTROABS 2.7 06/06/2021    ASSESSMENT & PLAN:  Malignant neoplasm of upper-outer quadrant of left breast in female, estrogen receptor positive (HCC) 03/16/2020: Left mastectomy (Dr. 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  (Personal history of breast cancer in 1999 for which she underwent a lumpectomy) T1b N0 stage Ia   Current Treatment: Anastrozole 1 mg daily started 03/2020 changed to Tamoxifen Toxicities: Leg cramps   Hypertension: Much improved   Breast Cancer Surveillance:  11/08/2021: mammogram: benign, density category B 06/17/2023: Breast exam: Benign    CT lumbar spine 01/07/2022: Benign 04/01/2022: Ultrasound right upper extremity: Benign 06/04/2022: Dr. Ulice Bold performed scar tissue surgery on the left breast.   RTC in 1 year ------------------------------------- Assessment and Plan    Breast Cancer (Post-Surgery) On tamoxifen following breast cancer surgery in October 2021. No reported issues, including no hot flashes or chest pain. Mammogram results have been excellent. Discussed the small risk of thromboembolism associated with tamoxifen and the importance of maintaining physical activity to mitigate this risk. Prefers to continue tamoxifen for the full five  years. - Continue tamoxifen for a total of five years - Schedule next follow-up visit in one year - Ensure regular mammograms at [facility]  Hypertension Reports blood pressure readings typically in the 140s to 150s. Currently on metoprolol ER 25 mg and another unspecified antihypertensive. Clinic readings tend to be higher than normal. Advised to monitor blood pressure at home and discuss management with primary care physician. - Monitor blood pressure at home - Discuss blood pressure management with primary care physician  General Health Maintenance Encouraged to maintain physical activity to mitigate the risk of thromboembolism associated with tamoxifen. Reports walking when the weather is nice and advised to continue indoor walking during colder weather. - Encourage regular  physical activity, including indoor walking  Follow-up - Schedule follow-up visit in one year.          No orders of the defined types were placed in this encounter.  The patient has a good understanding of the overall plan. she agrees with it. she will call with any problems that may develop before the next visit here. Total time spent: 30 mins including face to face time and time spent for planning, charting and co-ordination of care   Tamsen Meek, MD 06/17/23

## 2023-06-17 NOTE — Assessment & Plan Note (Signed)
03/16/2020: Left mastectomy (Dr. 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  (Personal history of breast cancer in 1999 for which she underwent a lumpectomy) T1b N0 stage Ia   Current Treatment: Anastrozole 1 mg daily started 03/2020 changed to Tamoxifen Toxicities: Leg cramps   Hypertension: Much improved   Breast Cancer Surveillance:  11/08/2021: mammogram: benign, density category B 06/17/2023: Breast exam: Benign    CT lumbar spine 01/07/2022: Benign 04/01/2022: Ultrasound right upper extremity: Benign 06/04/2022: Dr. Ulice Bold performed scar tissue surgery on the left breast.   RTC in 1 year

## 2023-07-01 ENCOUNTER — Encounter: Payer: Self-pay | Admitting: Plastic Surgery

## 2023-07-01 ENCOUNTER — Ambulatory Visit (INDEPENDENT_AMBULATORY_CARE_PROVIDER_SITE_OTHER): Payer: Medicare HMO | Admitting: Plastic Surgery

## 2023-07-01 VITALS — BP 162/73 | HR 75

## 2023-07-01 DIAGNOSIS — C50412 Malignant neoplasm of upper-outer quadrant of left female breast: Secondary | ICD-10-CM

## 2023-07-01 DIAGNOSIS — Z170 Estrogen receptor positive status [ER+]: Secondary | ICD-10-CM

## 2023-07-01 NOTE — Progress Notes (Signed)
   Subjective:    Patient ID: Sarah Barrett, female    DOB: Jul 11, 1940, 83 y.o.   MRN: [id number]  The patient is an 83 year old female here for follow-up on her left breast.  She had breast cancer and underwent a mastectomy.  She had some excess skin and some tightening in the area.  We did excision of excess breast tissue 1 year ago.  The area is nice and soft and moves nicely.  There is no areas of concern.  There are no concerning lumps or bumps.      Review of Systems  Constitutional: Negative.   HENT: Negative.    Eyes: Negative.   Respiratory: Negative.    Cardiovascular: Negative.   Gastrointestinal: Negative.   Genitourinary: Negative.   Musculoskeletal: Negative.        Objective:   Physical Exam Constitutional:      Appearance: Normal appearance.  HENT:     Head: Atraumatic.  Cardiovascular:     Rate and Rhythm: Normal rate.     Pulses: Normal pulses.  Musculoskeletal:        General: No swelling or deformity.  Skin:    General: Skin is warm.     Capillary Refill: Capillary refill takes less than 2 seconds.  Neurological:     Mental Status: She is alert and oriented to person, place, and time.  Psychiatric:        Mood and Affect: Mood normal.        Behavior: Behavior normal.        Thought Content: Thought content normal.        Judgment: Judgment normal.        Assessment & Plan:     ICD-10-CM   1. Malignant neoplasm of upper-outer quadrant of left breast in female, estrogen receptor positive (HCC)  C50.412    Z17.0        Recommend to continue to massage the area.  No changes needed at this time.  Will plan to see the patient back in 1 year and we can get pictures at that time.

## 2023-07-07 DIAGNOSIS — I10 Essential (primary) hypertension: Secondary | ICD-10-CM | POA: Diagnosis not present

## 2023-07-07 DIAGNOSIS — M8588 Other specified disorders of bone density and structure, other site: Secondary | ICD-10-CM | POA: Diagnosis not present

## 2023-07-07 DIAGNOSIS — E785 Hyperlipidemia, unspecified: Secondary | ICD-10-CM | POA: Diagnosis not present

## 2023-07-09 DIAGNOSIS — E785 Hyperlipidemia, unspecified: Secondary | ICD-10-CM | POA: Diagnosis not present

## 2023-07-09 DIAGNOSIS — Z0000 Encounter for general adult medical examination without abnormal findings: Secondary | ICD-10-CM | POA: Diagnosis not present

## 2023-07-09 DIAGNOSIS — M8588 Other specified disorders of bone density and structure, other site: Secondary | ICD-10-CM | POA: Diagnosis not present

## 2023-07-09 DIAGNOSIS — I10 Essential (primary) hypertension: Secondary | ICD-10-CM | POA: Diagnosis not present

## 2023-07-09 DIAGNOSIS — C50412 Malignant neoplasm of upper-outer quadrant of left female breast: Secondary | ICD-10-CM | POA: Diagnosis not present

## 2023-07-22 DIAGNOSIS — M8588 Other specified disorders of bone density and structure, other site: Secondary | ICD-10-CM | POA: Diagnosis not present

## 2023-07-22 DIAGNOSIS — Z853 Personal history of malignant neoplasm of breast: Secondary | ICD-10-CM | POA: Diagnosis not present

## 2023-07-24 ENCOUNTER — Other Ambulatory Visit: Payer: Self-pay | Admitting: Hematology and Oncology

## 2023-08-11 ENCOUNTER — Other Ambulatory Visit: Payer: Self-pay | Admitting: Physician Assistant

## 2023-08-11 ENCOUNTER — Ambulatory Visit
Admission: RE | Admit: 2023-08-11 | Discharge: 2023-08-11 | Disposition: A | Discharge status: Home / Self Care | Source: Ambulatory Visit | Attending: Physician Assistant | Admitting: Physician Assistant

## 2023-08-11 DIAGNOSIS — C50412 Malignant neoplasm of upper-outer quadrant of left female breast: Secondary | ICD-10-CM | POA: Diagnosis not present

## 2023-08-11 DIAGNOSIS — R079 Chest pain, unspecified: Secondary | ICD-10-CM

## 2023-08-11 DIAGNOSIS — Z853 Personal history of malignant neoplasm of breast: Secondary | ICD-10-CM | POA: Diagnosis not present

## 2023-08-11 DIAGNOSIS — R54 Age-related physical debility: Secondary | ICD-10-CM | POA: Diagnosis not present

## 2023-08-11 DIAGNOSIS — I10 Essential (primary) hypertension: Secondary | ICD-10-CM | POA: Diagnosis not present

## 2023-09-01 ENCOUNTER — Other Ambulatory Visit: Payer: Self-pay | Admitting: Physician Assistant

## 2023-09-01 DIAGNOSIS — Z853 Personal history of malignant neoplasm of breast: Secondary | ICD-10-CM

## 2023-09-01 DIAGNOSIS — R079 Chest pain, unspecified: Secondary | ICD-10-CM

## 2023-09-01 DIAGNOSIS — R0689 Other abnormalities of breathing: Secondary | ICD-10-CM

## 2023-09-02 DIAGNOSIS — N644 Mastodynia: Secondary | ICD-10-CM | POA: Diagnosis not present

## 2023-09-18 ENCOUNTER — Ambulatory Visit
Admission: RE | Admit: 2023-09-18 | Discharge: 2023-09-18 | Disposition: A | Discharge status: Home / Self Care | Source: Ambulatory Visit | Attending: Physician Assistant | Admitting: Physician Assistant

## 2023-09-18 DIAGNOSIS — R079 Chest pain, unspecified: Secondary | ICD-10-CM

## 2023-09-18 DIAGNOSIS — Z853 Personal history of malignant neoplasm of breast: Secondary | ICD-10-CM

## 2023-09-18 DIAGNOSIS — R0689 Other abnormalities of breathing: Secondary | ICD-10-CM

## 2023-09-18 DIAGNOSIS — R0789 Other chest pain: Secondary | ICD-10-CM | POA: Diagnosis not present

## 2023-09-18 MED ORDER — IOPAMIDOL (ISOVUE-300) INJECTION 61%
75.0000 mL | Freq: Once | INTRAVENOUS | Status: AC | PRN
  Administered 2023-09-18: 75 mL via INTRAVENOUS

## 2023-09-25 DIAGNOSIS — C50412 Malignant neoplasm of upper-outer quadrant of left female breast: Secondary | ICD-10-CM | POA: Diagnosis not present

## 2023-11-05 DIAGNOSIS — I10 Essential (primary) hypertension: Secondary | ICD-10-CM | POA: Diagnosis not present

## 2023-11-06 DIAGNOSIS — I517 Cardiomegaly: Secondary | ICD-10-CM | POA: Diagnosis not present

## 2023-11-10 DIAGNOSIS — M25511 Pain in right shoulder: Secondary | ICD-10-CM | POA: Diagnosis not present

## 2023-11-17 DIAGNOSIS — I10 Essential (primary) hypertension: Secondary | ICD-10-CM | POA: Diagnosis not present

## 2023-11-17 DIAGNOSIS — Z853 Personal history of malignant neoplasm of breast: Secondary | ICD-10-CM | POA: Diagnosis not present

## 2023-11-17 DIAGNOSIS — E785 Hyperlipidemia, unspecified: Secondary | ICD-10-CM | POA: Diagnosis not present

## 2023-11-17 DIAGNOSIS — C50412 Malignant neoplasm of upper-outer quadrant of left female breast: Secondary | ICD-10-CM | POA: Diagnosis not present

## 2023-11-25 DIAGNOSIS — R350 Frequency of micturition: Secondary | ICD-10-CM | POA: Diagnosis not present

## 2023-11-25 DIAGNOSIS — R3915 Urgency of urination: Secondary | ICD-10-CM | POA: Diagnosis not present

## 2023-12-04 DIAGNOSIS — I10 Essential (primary) hypertension: Secondary | ICD-10-CM | POA: Diagnosis not present

## 2023-12-18 DIAGNOSIS — E785 Hyperlipidemia, unspecified: Secondary | ICD-10-CM | POA: Diagnosis not present

## 2023-12-18 DIAGNOSIS — I10 Essential (primary) hypertension: Secondary | ICD-10-CM | POA: Diagnosis not present

## 2023-12-18 DIAGNOSIS — Z853 Personal history of malignant neoplasm of breast: Secondary | ICD-10-CM | POA: Diagnosis not present

## 2023-12-18 DIAGNOSIS — C50412 Malignant neoplasm of upper-outer quadrant of left female breast: Secondary | ICD-10-CM | POA: Diagnosis not present

## 2024-01-02 DIAGNOSIS — E785 Hyperlipidemia, unspecified: Secondary | ICD-10-CM | POA: Diagnosis not present

## 2024-01-02 DIAGNOSIS — M8580 Other specified disorders of bone density and structure, unspecified site: Secondary | ICD-10-CM | POA: Diagnosis not present

## 2024-01-02 DIAGNOSIS — I10 Essential (primary) hypertension: Secondary | ICD-10-CM | POA: Diagnosis not present

## 2024-01-02 DIAGNOSIS — M8588 Other specified disorders of bone density and structure, other site: Secondary | ICD-10-CM | POA: Diagnosis not present

## 2024-01-03 DIAGNOSIS — I10 Essential (primary) hypertension: Secondary | ICD-10-CM | POA: Diagnosis not present

## 2024-01-06 DIAGNOSIS — I10 Essential (primary) hypertension: Secondary | ICD-10-CM | POA: Diagnosis not present

## 2024-01-06 DIAGNOSIS — M8588 Other specified disorders of bone density and structure, other site: Secondary | ICD-10-CM | POA: Diagnosis not present

## 2024-01-06 DIAGNOSIS — R350 Frequency of micturition: Secondary | ICD-10-CM | POA: Diagnosis not present

## 2024-01-06 DIAGNOSIS — C50412 Malignant neoplasm of upper-outer quadrant of left female breast: Secondary | ICD-10-CM | POA: Diagnosis not present

## 2024-01-06 DIAGNOSIS — E785 Hyperlipidemia, unspecified: Secondary | ICD-10-CM | POA: Diagnosis not present

## 2024-01-06 DIAGNOSIS — R609 Edema, unspecified: Secondary | ICD-10-CM | POA: Diagnosis not present

## 2024-01-07 DIAGNOSIS — H04123 Dry eye syndrome of bilateral lacrimal glands: Secondary | ICD-10-CM | POA: Diagnosis not present

## 2024-01-07 DIAGNOSIS — H35371 Puckering of macula, right eye: Secondary | ICD-10-CM | POA: Diagnosis not present

## 2024-01-07 DIAGNOSIS — H43811 Vitreous degeneration, right eye: Secondary | ICD-10-CM | POA: Diagnosis not present

## 2024-01-07 DIAGNOSIS — H26491 Other secondary cataract, right eye: Secondary | ICD-10-CM | POA: Diagnosis not present

## 2024-01-07 DIAGNOSIS — Z961 Presence of intraocular lens: Secondary | ICD-10-CM | POA: Diagnosis not present

## 2024-01-18 DIAGNOSIS — I10 Essential (primary) hypertension: Secondary | ICD-10-CM | POA: Diagnosis not present

## 2024-01-18 DIAGNOSIS — Z853 Personal history of malignant neoplasm of breast: Secondary | ICD-10-CM | POA: Diagnosis not present

## 2024-01-18 DIAGNOSIS — C50412 Malignant neoplasm of upper-outer quadrant of left female breast: Secondary | ICD-10-CM | POA: Diagnosis not present

## 2024-01-18 DIAGNOSIS — E785 Hyperlipidemia, unspecified: Secondary | ICD-10-CM | POA: Diagnosis not present

## 2024-01-20 DIAGNOSIS — R21 Rash and other nonspecific skin eruption: Secondary | ICD-10-CM | POA: Diagnosis not present

## 2024-01-20 DIAGNOSIS — R300 Dysuria: Secondary | ICD-10-CM | POA: Diagnosis not present

## 2024-01-20 DIAGNOSIS — I10 Essential (primary) hypertension: Secondary | ICD-10-CM | POA: Diagnosis not present

## 2024-01-20 DIAGNOSIS — R350 Frequency of micturition: Secondary | ICD-10-CM | POA: Diagnosis not present

## 2024-02-02 DIAGNOSIS — I10 Essential (primary) hypertension: Secondary | ICD-10-CM | POA: Diagnosis not present

## 2024-02-17 DIAGNOSIS — E785 Hyperlipidemia, unspecified: Secondary | ICD-10-CM | POA: Diagnosis not present

## 2024-02-17 DIAGNOSIS — Z853 Personal history of malignant neoplasm of breast: Secondary | ICD-10-CM | POA: Diagnosis not present

## 2024-02-17 DIAGNOSIS — I10 Essential (primary) hypertension: Secondary | ICD-10-CM | POA: Diagnosis not present

## 2024-02-17 DIAGNOSIS — C50412 Malignant neoplasm of upper-outer quadrant of left female breast: Secondary | ICD-10-CM | POA: Diagnosis not present

## 2024-03-03 DIAGNOSIS — I10 Essential (primary) hypertension: Secondary | ICD-10-CM | POA: Diagnosis not present

## 2024-03-06 DIAGNOSIS — Z23 Encounter for immunization: Secondary | ICD-10-CM | POA: Diagnosis not present

## 2024-03-09 ENCOUNTER — Encounter (INDEPENDENT_AMBULATORY_CARE_PROVIDER_SITE_OTHER): Payer: Self-pay | Admitting: Otolaryngology

## 2024-03-09 ENCOUNTER — Ambulatory Visit (INDEPENDENT_AMBULATORY_CARE_PROVIDER_SITE_OTHER): Payer: Medicare HMO | Admitting: Otolaryngology

## 2024-03-09 VITALS — BP 154/74 | HR 70 | Temp 98.2°F | Ht 59.0 in | Wt 155.0 lb

## 2024-03-09 DIAGNOSIS — H6123 Impacted cerumen, bilateral: Secondary | ICD-10-CM | POA: Diagnosis not present

## 2024-03-09 DIAGNOSIS — H608X3 Other otitis externa, bilateral: Secondary | ICD-10-CM

## 2024-03-09 DIAGNOSIS — H903 Sensorineural hearing loss, bilateral: Secondary | ICD-10-CM | POA: Diagnosis not present

## 2024-03-09 MED ORDER — MOMETASONE FUROATE 0.1 % EX CREA: TOPICAL_CREAM | CUTANEOUS | 3 refills | Status: AC

## 2024-03-09 NOTE — Progress Notes (Signed)
 Patient ID: Sarah Barrett, female   DOB: 09/27/1940, 83 y.o.   MRN: [id number]  CC: Itchy ears, hearing loss  HPI: The patient is an 83 year old female who returns today complaining of itchy sensation in her ears and bilateral hearing loss.  The patient was last seen 1 year ago.  At that time, she was noted to have stable bilateral high-frequency sensorineural hearing loss, slightly worse on the right side at low frequencies.  The small possibility of a retrocochlear lesion causing her asymmetric hearing loss was discussed.  The patient elected to proceed with conservative observation.  The patient returns today reporting no change in her hearing.  She is wearing bilateral hearing aids.  She complains of frequent itchy sensation in her ears.  She denies any otalgia, otorrhea, or vertigo.  Exam: General: Communicates without difficulty, well nourished, no acute distress. Head: Normocephalic, no evidence injury, no tenderness, facial buttresses intact without stepoff. Face/sinus: No tenderness to palpation and percussion. Facial movement is normal and symmetric. Eyes: PERRL, EOMI. No scleral icterus, conjunctivae clear. Neuro: CN II exam reveals vision grossly intact.  No nystagmus at any point of gaze. Ears: Auricles well formed without lesions.  Bilateral cerumen impaction.  Nose: External evaluation reveals normal support and skin without lesions.  Dorsum is intact.  Anterior rhinoscopy reveals congested mucosa over anterior aspect of inferior turbinates and intact septum.  No purulence noted. Oral:  Oral cavity and oropharynx are intact, symmetric, without erythema or edema.  Mucosa is moist without lesions. Neck: Full range of motion without pain.  There is no significant lymphadenopathy.  No masses palpable.  Thyroid bed within normal limits to palpation.  Parotid glands and submandibular glands equal bilaterally without mass.  Trachea is midline. Neuro:  CN 2-12 grossly intact.   Procedure: Bilateral  cerumen disimpaction Anesthesia: None Description: Under the operating microscope, the cerumen is carefully removed with a combination of cerumen currette, alligator forceps, and suction catheters.  After the cerumen is removed, the TMs are noted to be normal.  Eczematous changes are noted within the ear canals.  No mass, erythema, or lesions. The patient tolerated the procedure well.    Assessment: 1.  Bilateral eczematous otitis externa. 2.  Bilateral cerumen impaction.  After the disimpaction procedure, both tympanic membranes and middle ear spaces are noted to be normal. No middle ear effusion or infection is noted.   3.  Subjectively stable bilateral high frequency sensorineural hearing loss, slightly worse on the right side at low frequencies.  Plan: 1.  Otomicroscopy with bilateral cerumen disimpaction. 2.  The physical exam findings are reviewed with the patient. 3.  Elocon cream to treat the chronic eczematous otitis externa. 4.  Continue the use of her hearing aids. 5.  The patient will return for reevaluation in 1 year, sooner if needed.

## 2024-03-19 DIAGNOSIS — C50412 Malignant neoplasm of upper-outer quadrant of left female breast: Secondary | ICD-10-CM | POA: Diagnosis not present

## 2024-03-19 DIAGNOSIS — Z853 Personal history of malignant neoplasm of breast: Secondary | ICD-10-CM | POA: Diagnosis not present

## 2024-03-19 DIAGNOSIS — E785 Hyperlipidemia, unspecified: Secondary | ICD-10-CM | POA: Diagnosis not present

## 2024-03-19 DIAGNOSIS — I10 Essential (primary) hypertension: Secondary | ICD-10-CM | POA: Diagnosis not present

## 2024-03-23 DIAGNOSIS — M1811 Unilateral primary osteoarthritis of first carpometacarpal joint, right hand: Secondary | ICD-10-CM | POA: Diagnosis not present

## 2024-04-04 DIAGNOSIS — E669 Obesity, unspecified: Secondary | ICD-10-CM | POA: Diagnosis not present

## 2024-04-04 DIAGNOSIS — M8580 Other specified disorders of bone density and structure, unspecified site: Secondary | ICD-10-CM | POA: Diagnosis not present

## 2024-04-04 DIAGNOSIS — R011 Cardiac murmur, unspecified: Secondary | ICD-10-CM | POA: Diagnosis not present

## 2024-04-04 DIAGNOSIS — M1990 Unspecified osteoarthritis, unspecified site: Secondary | ICD-10-CM | POA: Diagnosis not present

## 2024-04-04 DIAGNOSIS — C50919 Malignant neoplasm of unspecified site of unspecified female breast: Secondary | ICD-10-CM | POA: Diagnosis not present

## 2024-04-04 DIAGNOSIS — I10 Essential (primary) hypertension: Secondary | ICD-10-CM | POA: Diagnosis not present

## 2024-04-04 DIAGNOSIS — Z809 Family history of malignant neoplasm, unspecified: Secondary | ICD-10-CM | POA: Diagnosis not present

## 2024-04-04 DIAGNOSIS — Z8249 Family history of ischemic heart disease and other diseases of the circulatory system: Secondary | ICD-10-CM | POA: Diagnosis not present

## 2024-04-04 DIAGNOSIS — Z6831 Body mass index (BMI) 31.0-31.9, adult: Secondary | ICD-10-CM | POA: Diagnosis not present

## 2024-04-18 DIAGNOSIS — E785 Hyperlipidemia, unspecified: Secondary | ICD-10-CM | POA: Diagnosis not present

## 2024-04-18 DIAGNOSIS — I10 Essential (primary) hypertension: Secondary | ICD-10-CM | POA: Diagnosis not present

## 2024-04-18 DIAGNOSIS — C50412 Malignant neoplasm of upper-outer quadrant of left female breast: Secondary | ICD-10-CM | POA: Diagnosis not present

## 2024-04-18 DIAGNOSIS — Z853 Personal history of malignant neoplasm of breast: Secondary | ICD-10-CM | POA: Diagnosis not present

## 2024-06-17 ENCOUNTER — Inpatient Hospital Stay: Payer: Medicare HMO | Attending: Hematology and Oncology | Admitting: Hematology and Oncology

## 2024-06-17 VITALS — BP 154/76 | HR 85 | Temp 98.9°F | Resp 18 | Ht 59.0 in | Wt 168.1 lb

## 2024-06-17 DIAGNOSIS — Z170 Estrogen receptor positive status [ER+]: Secondary | ICD-10-CM

## 2024-06-17 DIAGNOSIS — C50412 Malignant neoplasm of upper-outer quadrant of left female breast: Secondary | ICD-10-CM | POA: Diagnosis not present

## 2024-06-17 MED ORDER — TAMOXIFEN CITRATE 20 MG PO TABS: 20.0000 mg | ORAL_TABLET | Freq: Every day | ORAL | 2 refills | Status: AC

## 2024-06-17 NOTE — Progress Notes (Signed)
 "  Patient Care Team: Leonel Cole, MD as PCP - General (Family Medicine) Odean Potts, MD as Consulting Physician (Hematology and Oncology) Ebbie Cough, MD as Consulting Physician (General Surgery)  DIAGNOSIS:  Encounter Diagnosis  Name Primary?   Malignant neoplasm of upper-outer quadrant of left breast in female, estrogen receptor positive (HCC) Yes    SUMMARY OF ONCOLOGIC HISTORY: Oncology History  Malignant neoplasm of upper-outer quadrant of left breast in female, estrogen receptor positive (HCC)  02/21/2020 Initial Diagnosis   Patient presented to PCP with swollen, erythematous left breast and an inverted nipple. Mammogram and US  on 02/01/20 showed two masses at the 2 o'clock position, 1.7cm and 0.7cm. Biopsy on 02/21/20 showed invasive ductal carcinoma, grade 2, HER-2 negative (1+), ER/PR+ 95%, KI67 15%.    02/21/2020 Cancer Staging   Staging form: Breast, AJCC 8th Edition - Clinical stage from 02/21/2020: Stage IA (cT1c, cN0, cM0, G2, ER+, PR+, HER2-) Stage prefix: Initial diagnosis    03/16/2020 Surgery   Left mastectomy (Dr. Ebbie) [phone]): Multifocal invasive ductal carcinoma and DCIS, grade 2, 0.9cm, clear margins ER/PR 95% positive, HER-2 negative, Ki-67 15%.  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  She was diagnosed in 2021 and treated with left mastectomy followed by adjuvant endocrine therapy. Anastrozole  was switched to tamoxifen  due to blood pressure concerns. She is over four years into a planned five-year tamoxifen  course, expected to complete in October 2026. She denies adverse effects from tamoxifen  and has reliable access to medication.  She is asymptomatic from her breast cancer and treatment, with no pain or discomfort in the left chest. She continues annual mammograms. The most recent study was described to her as normal. She has no new breast symptoms or concerns.  She reports stable overall health and independent ambulation. She usually uses a walker but used her companion for support at this visit. She notes no new functional limitations or changes in general health.      ALLERGIES:  is allergic to carvedilol , hydrochlorothiazide, morphine  and codeine, and prednisone.  MEDICATIONS:  Current Outpatient Medications  Medication Sig Dispense Refill   Calcium Carb-Cholecalciferol (CALCIUM 600+D3 PO) Take 1 tablet by mouth daily.     Cholecalciferol 50 MCG (2000 UT) TABS 50 tablets.     metoprolol  succinate (TOPROL -XL) 25 MG 24 hr tablet Take 1 tablet (25 mg total) by mouth daily.     mometasone  (ELOCON ) 0.1 % cream Apply topically daily as needed for itch 15 g 3   olmesartan (BENICAR) 40 MG tablet Take 40 mg by mouth daily.     Omega-3 Fatty Acids (FISH OIL) 1000 MG CAPS Take 1,000 mg by mouth daily.     tamoxifen  (NOLVADEX ) 20 MG tablet TAKE 1 TABLET (20 MG TOTAL) BY MOUTH DAILY. 90 tablet 3   No current facility-administered  medications for this visit.    PHYSICAL EXAMINATION: ECOG PERFORMANCE STATUS: 1 - Symptomatic but completely ambulatory  Vitals:   06/17/24 1510 06/17/24 1514  BP: (!) 165/73 (!) 154/76  Pulse: 85   Resp: 18   Temp: 98.9 F (37.2 C)   SpO2: 99%    Filed Weights   06/17/24 1510  Weight: 168 lb  1.6 oz (76.2 kg)   LABORATORY DATA:  I have reviewed the data as listed    Latest Ref Rng & Units 04/04/2023    6:22 PM 06/06/2021   11:17 AM 02/04/2019    6:14 AM  CMP  Glucose 70 - 99 mg/dL 890  899  90   BUN 8 - 23 mg/dL 13  12  15    Creatinine 0.44 - 1.00 mg/dL 9.42  9.32  9.39   Sodium 135 - 145 mmol/L 134  133  139   Potassium 3.5 - 5.1 mmol/L 4.0  4.0  4.0   Chloride 98 - 111 mmol/L 103  101  103   CO2 22 - 32 mmol/L 25  29    Calcium 8.9 - 10.3 mg/dL 9.0  9.5    Total Protein 6.5 - 8.1 g/dL  6.9    Total Bilirubin 0.3 - 1.2 mg/dL  0.3    Alkaline Phos 38 - 126 U/L  28    AST 15 - 41 U/L  19    ALT 0 - 44 U/L  18      Lab Results  Component Value Date   WBC 7.0 04/04/2023   HGB 13.7 04/04/2023   HCT 41.7 04/04/2023   MCV 92.1 04/04/2023   PLT 172 04/04/2023   NEUTROABS 2.7 06/06/2021    ASSESSMENT & PLAN:  Malignant neoplasm of upper-outer quadrant of left breast in female, estrogen receptor positive (HCC) 03/16/2020: Left mastectomy (Dr. Ebbie): Multifocal invasive ductal carcinoma and DCIS, grade 2, 0.9cm, clear margins ER/PR 95% positive, HER-2 negative, Ki-67 15%.  (Personal history of breast cancer in 1999 for which she underwent a lumpectomy) T1b N0 stage Ia   Current Treatment: Anastrozole  1 mg daily started 03/2020 changed to Tamoxifen  Toxicities: Leg cramps   Hypertension: Much improved   Breast Cancer Surveillance:  11/08/2021: mammogram: benign, density category B   CT lumbar spine 01/07/2022: Benign 04/01/2022: Ultrasound right upper extremity: Benign 06/04/2022: Dr. Lowery performed scar tissue surgery on the left breast.   I recommended discontinuation of antiestrogen therapy by November 2026 at which point she would have completed 5 years of therapy.  Return to clinic on an as-needed basis   No orders of the defined types were placed in this encounter.  The patient has a good understanding of the overall plan. she agrees with it. she  will call with any problems that may develop before the next visit here.  I personally spent a total of 30 minutes in the care of the patient today including preparing to see the patient, getting/reviewing separately obtained history, performing a medically appropriate exam/evaluation, counseling and educating, placing orders, referring and communicating with other health care professionals, documenting clinical information in the EHR, independently interpreting results, communicating results, and coordinating care.   Dr.Inetha Maret 06/17/24    "

## 2024-06-17 NOTE — Assessment & Plan Note (Signed)
 03/16/2020: Left mastectomy (Dr. Ebbie): Multifocal invasive ductal carcinoma and DCIS, grade 2, 0.9cm, clear margins ER/PR 95% positive, HER-2 negative, Ki-67 15%.  (Personal history of breast cancer in 1999 for which she underwent a lumpectomy) T1b N0 stage Ia   Current Treatment: Anastrozole  1 mg daily started 03/2020 changed to Tamoxifen  Toxicities: Leg cramps   Hypertension: Much improved   Breast Cancer Surveillance:  11/08/2021: mammogram: benign, density category B 06/17/2024: Breast exam: Benign    CT lumbar spine 01/07/2022: Benign 04/01/2022: Ultrasound right upper extremity: Benign 06/04/2022: Dr. Lowery performed scar tissue surgery on the left breast.   I recommended discontinuation of antiestrogen therapy by November 2026 at which point she would have completed 5 years of therapy.  Return to clinic on an as-needed basis

## 2024-06-24 ENCOUNTER — Encounter: Payer: Self-pay | Admitting: Physician Assistant

## 2024-06-29 ENCOUNTER — Ambulatory Visit: Payer: Medicare HMO | Admitting: Plastic Surgery
